# Patient Record
Sex: Female | Born: 1965
Health system: Southern US, Community
[De-identification: ages and names within clinical notes are randomized; demographics above are authoritative.]

## PROBLEM LIST (undated history)

## (undated) DIAGNOSIS — I10 Essential (primary) hypertension: Secondary | ICD-10-CM

## (undated) DIAGNOSIS — E7800 Pure hypercholesterolemia, unspecified: Secondary | ICD-10-CM

## (undated) DIAGNOSIS — R112 Nausea with vomiting, unspecified: Secondary | ICD-10-CM

## (undated) DIAGNOSIS — F329 Major depressive disorder, single episode, unspecified: Secondary | ICD-10-CM

## (undated) DIAGNOSIS — M1990 Unspecified osteoarthritis, unspecified site: Secondary | ICD-10-CM

## (undated) DIAGNOSIS — F419 Anxiety disorder, unspecified: Secondary | ICD-10-CM

## (undated) DIAGNOSIS — G43829 Menstrual migraine, not intractable, without status migrainosus: Secondary | ICD-10-CM

## (undated) DIAGNOSIS — K219 Gastro-esophageal reflux disease without esophagitis: Secondary | ICD-10-CM

## (undated) DIAGNOSIS — B9681 Helicobacter pylori [H. pylori] as the cause of diseases classified elsewhere: Secondary | ICD-10-CM

## (undated) DIAGNOSIS — F32A Depression, unspecified: Secondary | ICD-10-CM

## (undated) DIAGNOSIS — Z309 Encounter for contraceptive management, unspecified: Secondary | ICD-10-CM

## (undated) DIAGNOSIS — K2970 Gastritis, unspecified, without bleeding: Secondary | ICD-10-CM

## (undated) DIAGNOSIS — Z9889 Other specified postprocedural states: Secondary | ICD-10-CM

## (undated) HISTORY — DX: Pure hypercholesterolemia, unspecified: E78.00

## (undated) HISTORY — DX: Helicobacter pylori (H. pylori) as the cause of diseases classified elsewhere: B96.81

## (undated) HISTORY — PX: CARPAL TUNNEL RELEASE: SHX101

## (undated) HISTORY — DX: Depression, unspecified: F32.A

## (undated) HISTORY — DX: Menstrual migraine, not intractable, without status migrainosus: G43.829

## (undated) HISTORY — DX: Essential (primary) hypertension: I10

## (undated) HISTORY — DX: Major depressive disorder, single episode, unspecified: F32.9

## (undated) HISTORY — PX: CHOLECYSTECTOMY: SHX55

## (undated) HISTORY — DX: Encounter for contraceptive management, unspecified: Z30.9

## (undated) HISTORY — DX: Helicobacter pylori (H. pylori) as the cause of diseases classified elsewhere: K29.70

---

## 2001-06-18 ENCOUNTER — Other Ambulatory Visit: Admission: RE | Admit: 2001-06-18 | Discharge: 2001-06-18 | Payer: Self-pay | Admitting: Obstetrics and Gynecology

## 2002-06-29 ENCOUNTER — Emergency Department (HOSPITAL_COMMUNITY): Admission: EM | Admit: 2002-06-29 | Discharge: 2002-06-29 | Payer: Self-pay | Admitting: *Deleted

## 2002-06-29 ENCOUNTER — Encounter: Payer: Self-pay | Admitting: *Deleted

## 2006-11-01 ENCOUNTER — Ambulatory Visit: Payer: Self-pay | Admitting: Orthopedic Surgery

## 2007-03-28 ENCOUNTER — Ambulatory Visit: Payer: Self-pay | Admitting: Orthopedic Surgery

## 2007-04-02 ENCOUNTER — Ambulatory Visit (HOSPITAL_COMMUNITY): Admission: RE | Admit: 2007-04-02 | Discharge: 2007-04-02 | Payer: Self-pay | Admitting: Orthopedic Surgery

## 2007-04-10 ENCOUNTER — Ambulatory Visit: Payer: Self-pay | Admitting: Orthopedic Surgery

## 2007-07-17 ENCOUNTER — Ambulatory Visit: Payer: Self-pay | Admitting: Orthopedic Surgery

## 2007-08-05 ENCOUNTER — Other Ambulatory Visit: Admission: RE | Admit: 2007-08-05 | Discharge: 2007-08-05 | Payer: Self-pay | Admitting: Obstetrics and Gynecology

## 2008-07-29 ENCOUNTER — Ambulatory Visit (HOSPITAL_COMMUNITY): Admission: RE | Admit: 2008-07-29 | Discharge: 2008-07-29 | Payer: Self-pay | Admitting: Obstetrics and Gynecology

## 2008-08-12 ENCOUNTER — Other Ambulatory Visit: Admission: RE | Admit: 2008-08-12 | Discharge: 2008-08-12 | Payer: Self-pay | Admitting: Obstetrics and Gynecology

## 2008-08-18 ENCOUNTER — Ambulatory Visit (HOSPITAL_COMMUNITY): Admission: RE | Admit: 2008-08-18 | Discharge: 2008-08-18 | Payer: Self-pay | Admitting: Obstetrics & Gynecology

## 2009-08-11 ENCOUNTER — Ambulatory Visit (HOSPITAL_COMMUNITY): Admission: RE | Admit: 2009-08-11 | Discharge: 2009-08-11 | Payer: Self-pay | Admitting: Obstetrics and Gynecology

## 2009-08-16 ENCOUNTER — Other Ambulatory Visit: Admission: RE | Admit: 2009-08-16 | Discharge: 2009-08-16 | Payer: Self-pay | Admitting: Obstetrics and Gynecology

## 2010-08-15 ENCOUNTER — Ambulatory Visit (HOSPITAL_COMMUNITY): Admission: RE | Admit: 2010-08-15 | Discharge: 2010-08-15 | Payer: Self-pay | Admitting: Obstetrics and Gynecology

## 2010-08-25 ENCOUNTER — Other Ambulatory Visit: Admission: RE | Admit: 2010-08-25 | Discharge: 2010-08-25 | Payer: Self-pay | Admitting: Obstetrics and Gynecology

## 2011-07-24 ENCOUNTER — Other Ambulatory Visit: Payer: Self-pay | Admitting: Obstetrics and Gynecology

## 2011-07-24 DIAGNOSIS — Z139 Encounter for screening, unspecified: Secondary | ICD-10-CM

## 2011-08-18 ENCOUNTER — Ambulatory Visit (HOSPITAL_COMMUNITY)
Admission: RE | Admit: 2011-08-18 | Discharge: 2011-08-18 | Disposition: A | Discharge status: Home / Self Care | Payer: BC Managed Care – PPO | Source: Ambulatory Visit | Attending: Obstetrics and Gynecology | Admitting: Obstetrics and Gynecology

## 2011-08-18 DIAGNOSIS — Z139 Encounter for screening, unspecified: Secondary | ICD-10-CM

## 2011-08-18 DIAGNOSIS — Z1231 Encounter for screening mammogram for malignant neoplasm of breast: Secondary | ICD-10-CM | POA: Insufficient documentation

## 2011-08-29 ENCOUNTER — Other Ambulatory Visit (HOSPITAL_COMMUNITY)
Admission: RE | Admit: 2011-08-29 | Discharge: 2011-08-29 | Disposition: A | Discharge status: Home / Self Care | Payer: BC Managed Care – PPO | Source: Ambulatory Visit | Attending: Obstetrics and Gynecology | Admitting: Obstetrics and Gynecology

## 2011-08-29 ENCOUNTER — Other Ambulatory Visit: Payer: Self-pay | Admitting: Adult Health

## 2011-08-29 DIAGNOSIS — Z01419 Encounter for gynecological examination (general) (routine) without abnormal findings: Secondary | ICD-10-CM | POA: Insufficient documentation

## 2012-07-30 ENCOUNTER — Other Ambulatory Visit: Payer: Self-pay | Admitting: Adult Health

## 2012-07-30 DIAGNOSIS — Z139 Encounter for screening, unspecified: Secondary | ICD-10-CM

## 2012-08-22 ENCOUNTER — Ambulatory Visit (HOSPITAL_COMMUNITY)
Admission: RE | Admit: 2012-08-22 | Discharge: 2012-08-22 | Disposition: A | Discharge status: Home / Self Care | Payer: BC Managed Care – PPO | Source: Ambulatory Visit | Attending: Adult Health | Admitting: Adult Health

## 2012-08-22 DIAGNOSIS — Z139 Encounter for screening, unspecified: Secondary | ICD-10-CM

## 2012-08-22 DIAGNOSIS — Z1231 Encounter for screening mammogram for malignant neoplasm of breast: Secondary | ICD-10-CM | POA: Insufficient documentation

## 2012-08-30 ENCOUNTER — Other Ambulatory Visit: Payer: Self-pay | Admitting: Adult Health

## 2012-08-30 ENCOUNTER — Other Ambulatory Visit (HOSPITAL_COMMUNITY)
Admission: RE | Admit: 2012-08-30 | Discharge: 2012-08-30 | Disposition: A | Discharge status: Home / Self Care | Payer: BC Managed Care – PPO | Source: Ambulatory Visit | Attending: Obstetrics and Gynecology | Admitting: Obstetrics and Gynecology

## 2012-08-30 DIAGNOSIS — Z01419 Encounter for gynecological examination (general) (routine) without abnormal findings: Secondary | ICD-10-CM | POA: Insufficient documentation

## 2012-08-30 DIAGNOSIS — Z1151 Encounter for screening for human papillomavirus (HPV): Secondary | ICD-10-CM | POA: Insufficient documentation

## 2012-10-07 ENCOUNTER — Encounter: Payer: Self-pay | Admitting: Gastroenterology

## 2012-10-07 ENCOUNTER — Ambulatory Visit (INDEPENDENT_AMBULATORY_CARE_PROVIDER_SITE_OTHER): Payer: BC Managed Care – PPO | Admitting: Gastroenterology

## 2012-10-07 ENCOUNTER — Encounter (HOSPITAL_COMMUNITY): Payer: Self-pay | Admitting: Pharmacy Technician

## 2012-10-07 VITALS — BP 141/87 | HR 73 | Temp 97.4°F | Ht 62.0 in | Wt 225.8 lb

## 2012-10-07 DIAGNOSIS — R195 Other fecal abnormalities: Secondary | ICD-10-CM

## 2012-10-07 DIAGNOSIS — K219 Gastro-esophageal reflux disease without esophagitis: Secondary | ICD-10-CM | POA: Insufficient documentation

## 2012-10-07 MED ORDER — PANTOPRAZOLE SODIUM 40 MG PO TBEC: 40.0000 mg | DELAYED_RELEASE_TABLET | Freq: Every day | ORAL | Status: DC

## 2012-10-07 NOTE — Assessment & Plan Note (Addendum)
46 year old female with heme + stool; also notes intermittent scant paper hematochezia. Notes hx of hemorrhoids. No change in bowel habits, constipation, or diarrhea. No abdominal pain. Likely benign anorectal source; however, unable to exclude underlying occult malignancy. Proceed with TCS in near future.   Proceed with colonoscopy with Dr. Darrick Penna in the near future. The risks, benefits, and alternatives have been discussed in detail with the patient. They state understanding and desire to proceed.

## 2012-10-07 NOTE — Progress Notes (Signed)
Referring Provider: No ref. provider found Primary Care Physician:  GRIFFIN,JENNIFER, NP Primary Gastroenterologist:  Dr. Darrick Penna   Chief Complaint  Patient presents with  . Heme + stools    HPI:   Pleasant 46 year old female who presents today at the kind referral of Cyril Mourning, FNP, from [facility]. Heme positive. Notes hx of hemorrhoids, states sees scant hematochezia on paper at times. No constipation, diarrhea, changes in bowel habits. Has BM 2-3 times per day, not diarrhea. No weight changes or lack of appetite. No abdominal pain. +reflux intermittently, usually nocturnal, wakes her up. No dysphagia. No PPI, takes Tums intermittently. 12 hour shifts, would eat then go right back to bed. 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 No longer weekends. Works at [employer], son now works after school as part-time. Senior in high school.   Past Medical History  Diagnosis Date  . Hypercholesteremia   . Depression     Past Surgical History  Procedure Date  . Cholecystectomy   . Carpal tunnel release     left    Current Outpatient Prescriptions  Medication Sig Dispense Refill  . buPROPion (WELLBUTRIN XL) 300 MG 24 hr tablet Take 300 mg by mouth every morning.       . citalopram (CELEXA) 20 MG tablet Take 20 mg by mouth daily.       . meloxicam (MOBIC) 15 MG tablet Take 15 mg by mouth daily.      . Norethindrone Acetate-Ethinyl Estrad-FE (LOESTRIN 24 FE) 1-20 MG-MCG(24) tablet Take 1 tablet by mouth daily.      . pravastatin (PRAVACHOL) 20 MG tablet Take 20 mg by mouth daily.      . pantoprazole (PROTONIX) 40 MG tablet Take 1 tablet (40 mg total) by mouth daily. Take 30 minutes before first meal of day.  30 tablet  3    Allergies as of 10/07/2012  . (No Known Allergies)    Family History  Problem Relation Age of Onset  . Colon cancer Neg Hx     History   Social History  . Marital Status: Married    Spouse Name: N/A    Number of Children: 1  .  Years of Education: N/A   Occupational History  . manager     [employer]   Social History Main Topics  . Smoking status: Never Smoker   . Smokeless tobacco: Not on file  . Alcohol Use: No  . Drug Use: No  . Sexually Active: Not Currently -- Female partner(s)    Birth Control/ Protection: Pill     Comment: can't remember last time had sex   Other Topics Concern  . Not on file   Social History Narrative  . No narrative on file    Review of Systems: Gen: Denies any fever, chills, loss of appetite, fatigue, weight loss. CV: Denies chest pain, heart palpitations, syncope, peripheral edema. Resp: Denies shortness of breath with rest, cough, wheezing GI: SEE HPI GU : Denies urinary burning, urinary frequency, urinary incontinence.  MS: Denies joint pain, muscle weakness, cramps, limited movement Derm: Denies rash, itching, dry skin Psych: situational depression Heme: Denies bruising, bleeding, and enlarged lymph nodes.  Physical Exam: BP 141/87  Pulse 73  Temp 97.4 F (36.3 C) (Oral)  Ht 5\' 2"  (1.575 m)  Wt 225 lb 12.8 oz (102.422 kg)  BMI 41.30 kg/m2 General:   Alert and oriented. Well-developed, well-nourished, pleasant and cooperative. Head:  Normocephalic and atraumatic. Eyes:  Conjunctiva pink,  sclera clear, no icterus.   Conjunctiva pink. Ears:  Normal auditory acuity. Nose:  No deformity, discharge,  or lesions. Mouth:  No deformity or lesions, mucosa pink and moist.  Neck:  Supple, without mass or thyromegaly. Lungs:  Clear to auscultation bilaterally, without wheezing, rales, or rhonchi.  Heart:  S1, S2 present without murmurs noted.  Abdomen:  +BS, soft, non-tender and non-distended. Without mass or HSM. No rebound or guarding. No hernias noted. Rectal:  Deferred  Msk:  Symmetrical without gross deformities. Normal posture. Extremities:  Without clubbing or edema. Neurologic:  Alert and  oriented x4;  grossly normal neurologically. Skin:  Intact, warm and dry  without significant lesions or rashes Cervical Nodes:  No significant cervical adenopathy. Psych:  Alert and cooperative. Normal mood and affect.

## 2012-10-07 NOTE — Assessment & Plan Note (Signed)
Likely dietary and behavior related. Tums intermittently. Trial of Protonix daily. Prescription provided. I did not choose Prilosec due to possible interaction with Celexa. No need for upper endoscopy at this point; denies any concerning upper GI symptoms.

## 2012-10-07 NOTE — Patient Instructions (Addendum)
Start taking Protonix 30 minutes before your first meal of the day. This is for reflux. I chose this instead of Prilosec due to possible interaction of Prilosec with Celexa.   Review the reflux diet. As we talked about, weight loss is important as well as avoiding eating late at night. Make sure your last meal is several hours before you actually go to bed.   We have set you up for a colonoscopy in the near future.   Please bring your labwork by to [place] for review. Thanks!  Have a wonderful Christmas.

## 2012-10-10 NOTE — Progress Notes (Signed)
Faxed to PCP

## 2012-10-14 ENCOUNTER — Encounter (HOSPITAL_COMMUNITY)
Admission: RE | Disposition: A | Discharge status: Home / Self Care | Payer: Self-pay | Source: Ambulatory Visit | Attending: Gastroenterology

## 2012-10-14 ENCOUNTER — Encounter (HOSPITAL_COMMUNITY): Payer: Self-pay | Admitting: *Deleted

## 2012-10-14 ENCOUNTER — Ambulatory Visit (HOSPITAL_COMMUNITY)
Admission: RE | Admit: 2012-10-14 | Discharge: 2012-10-14 | Disposition: A | Discharge status: Home / Self Care | Payer: BC Managed Care – PPO | Source: Ambulatory Visit | Attending: Gastroenterology | Admitting: Gastroenterology

## 2012-10-14 DIAGNOSIS — K5730 Diverticulosis of large intestine without perforation or abscess without bleeding: Secondary | ICD-10-CM

## 2012-10-14 DIAGNOSIS — R195 Other fecal abnormalities: Secondary | ICD-10-CM

## 2012-10-14 DIAGNOSIS — D126 Benign neoplasm of colon, unspecified: Secondary | ICD-10-CM | POA: Insufficient documentation

## 2012-10-14 DIAGNOSIS — A048 Other specified bacterial intestinal infections: Secondary | ICD-10-CM | POA: Insufficient documentation

## 2012-10-14 DIAGNOSIS — K2990 Gastroduodenitis, unspecified, without bleeding: Secondary | ICD-10-CM

## 2012-10-14 DIAGNOSIS — K2970 Gastritis, unspecified, without bleeding: Secondary | ICD-10-CM

## 2012-10-14 DIAGNOSIS — K648 Other hemorrhoids: Secondary | ICD-10-CM

## 2012-10-14 DIAGNOSIS — K2940 Chronic atrophic gastritis without bleeding: Secondary | ICD-10-CM | POA: Insufficient documentation

## 2012-10-14 HISTORY — PX: ESOPHAGOGASTRODUODENOSCOPY: SHX5428

## 2012-10-14 HISTORY — PX: COLONOSCOPY: SHX5424

## 2012-10-14 HISTORY — DX: Nausea with vomiting, unspecified: R11.2

## 2012-10-14 HISTORY — DX: Other specified postprocedural states: Z98.890

## 2012-10-14 SURGERY — COLONOSCOPY
Anesthesia: Moderate Sedation

## 2012-10-14 MED ORDER — BUTAMBEN-TETRACAINE-BENZOCAINE 2-2-14 % EX AERO
INHALATION_SPRAY | CUTANEOUS | Status: DC | PRN
  Administered 2012-10-14: 2 via TOPICAL

## 2012-10-14 MED ORDER — PROMETHAZINE HCL 25 MG/ML IJ SOLN
INTRAMUSCULAR | Status: AC
  Filled 2012-10-14: qty 1

## 2012-10-14 MED ORDER — MEPERIDINE HCL 100 MG/ML IJ SOLN
INTRAMUSCULAR | Status: AC
  Filled 2012-10-14: qty 2

## 2012-10-14 MED ORDER — SODIUM CHLORIDE 0.9 % IJ SOLN
INTRAMUSCULAR | Status: AC
  Filled 2012-10-14: qty 10

## 2012-10-14 MED ORDER — SODIUM CHLORIDE 0.45 % IV SOLN
INTRAVENOUS | Status: DC
  Administered 2012-10-14: 11:00:00 via INTRAVENOUS

## 2012-10-14 MED ORDER — MEPERIDINE HCL 100 MG/ML IJ SOLN
INTRAMUSCULAR | Status: DC | PRN
  Administered 2012-10-14 (×2): 50 mg via INTRAVENOUS

## 2012-10-14 MED ORDER — STERILE WATER FOR IRRIGATION IR SOLN
Status: DC | PRN
  Administered 2012-10-14: 12:00:00

## 2012-10-14 MED ORDER — MIDAZOLAM HCL 5 MG/5ML IJ SOLN
INTRAMUSCULAR | Status: DC | PRN
  Administered 2012-10-14 (×2): 2 mg via INTRAVENOUS
  Administered 2012-10-14: 1 mg via INTRAVENOUS

## 2012-10-14 MED ORDER — MIDAZOLAM HCL 5 MG/5ML IJ SOLN
INTRAMUSCULAR | Status: AC
  Filled 2012-10-14: qty 10

## 2012-10-14 MED ORDER — PROMETHAZINE HCL 25 MG/ML IJ SOLN
INTRAMUSCULAR | Status: DC | PRN
  Administered 2012-10-14: 12.5 mg via INTRAVENOUS

## 2012-10-14 NOTE — H&P (Signed)
  Primary Care Physician:  GRIFFIN,JENNIFER, NP Primary Gastroenterologist:  Dr. Darrick Penna  Pre-Procedure History & Physical: HPI:  Pamela Roy is a 46 y.o. female here for HEME POS STOOLS. USES NSAIDS.   Past Medical History  Diagnosis Date  . Hypercholesteremia   . Depression   . PONV (postoperative nausea and vomiting)     Past Surgical History  Procedure Date  . Cholecystectomy   . Carpal tunnel release     left    Prior to Admission medications   Medication Sig Start Date End Date Taking? Authorizing Provider  buPROPion (WELLBUTRIN XL) 300 MG 24 hr tablet Take 300 mg by mouth every morning.  07/24/12  Yes Historical Provider, MD  citalopram (CELEXA) 20 MG tablet Take 20 mg by mouth daily.  07/29/12  Yes Historical Provider, MD  Norethindrone Acetate-Ethinyl Estrad-FE (LOESTRIN 24 FE) 1-20 MG-MCG(24) tablet Take 1 tablet by mouth daily.   Yes Historical Provider, MD  pantoprazole (PROTONIX) 40 MG tablet Take 1 tablet (40 mg total) by mouth daily. Take 30 minutes before first meal of day. 10/07/12  Yes Nira Retort, NP  pravastatin (PRAVACHOL) 20 MG tablet Take 20 mg by mouth daily.   Yes Historical Provider, MD  meloxicam (MOBIC) 15 MG tablet Take 15 mg by mouth daily.    Historical Provider, MD    Allergies as of 10/07/2012  . (No Known Allergies)    Family History  Problem Relation Age of Onset  . Colon cancer Neg Hx     History   Social History  . Marital Status: Married    Spouse Name: N/A    Number of Children: 1  . Years of Education: N/A   Occupational History  . manager     [employer]   Social History Main Topics  . Smoking status: Never Smoker   . Smokeless tobacco: Not on file  . Alcohol Use: No  . Drug Use: No  . Sexually Active: Not Currently -- Female partner(s)    Birth Control/ Protection: Pill     Comment: can't remember last time had sex   Other Topics Concern  . Not on file   Social History Narrative  . No narrative on file    Review of  Systems: See HPI, otherwise negative ROS   Physical Exam: BP 136/87  Pulse 75  Temp 98.1 F (36.7 C) (Oral)  Resp 21  Ht 5\' 2"  (1.575 m)  Wt 225 lb (102.059 kg)  BMI 41.15 kg/m2  SpO2 97%  LMP 09/23/2012 General:   Alert,  pleasant and cooperative in NAD Head:  Normocephalic and atraumatic. Neck:  Supple; Lungs:  Clear throughout to auscultation.    Heart:  Regular rate and rhythm. Abdomen:  Soft, nontender and nondistended. Normal bowel sounds, without guarding, and without rebound.   Neurologic:  Alert and  oriented x4;  grossly normal neurologically.  Impression/Plan:     HEME POS STOOLS  PLAN:  1.TCS/?EGD TODAY

## 2012-10-14 NOTE — Op Note (Signed)
[facility] [address] [place], [address]   COLONOSCOPY PROCEDURE REPORT  PATIENT: Pamela, Roy  MR#: [id number] BIRTHDATE: 1966/06/10 , 46  yrs. old GENDER: Female ENDOSCOPIST: Jonette Eva, MD REFERRED [id number] Griffin PROCEDURE DATE:  10/14/2012 PROCEDURE:   Colonoscopy with cold biopsy polypectomy INDICATIONS:heme positive stools ON MOBIC  MEDICATIONS: Demerol 100 mg IV, Promethazine (Phenergan) 12.5mg  IV, and Versed 4 mg IV  DESCRIPTION OF PROCEDURE:    Physical exam was performed.  Informed consent was obtained from the patient after explaining the benefits, risks, and alternatives to procedure.  The patient was connected to monitor and placed in left lateral position. Continuous oxygen was provided by nasal cannula and IV medicine administered through an indwelling cannula.  After administration of sedation and rectal exam, the patients rectum was intubated and the Pentax Colonoscope [id number]  colonoscope was advanced under direct visualization to the ileum.  The scope was removed slowly by carefully examining the color, texture, anatomy, and integrity mucosa on the way out.  The patient was recovered in endoscopy and discharged home in satisfactory condition.       COLON FINDINGS: Two sessile polyps ranging between 3-52mm in size were found at the hepatic flexure and in the sigmoid colon.  A polypectomy was performed with cold forceps.  , Mild diverticulosis was noted in the sigmoid colon.  , The colon mucosa was otherwise normal.  , The mucosa appeared normal in the terminal ileum.  , and Small internal hemorrhoids were found.  PREP QUALITY: good. CECAL W/D TIME: 12 minutes  COMPLICATIONS: None  ENDOSCOPIC IMPRESSION: 1.   Two sessile polyps ranging between 3-54mm in size were found at the hepatic flexure and in the sigmoid colon; polypectomy was performed with cold forceps 2.   Mild diverticulosis was noted in the sigmoid colon 3.    The colon mucosa was otherwise normal 4.   Normal mucosa in the terminal ileum 5.   Small internal hemorrhoids    RECOMMENDATIONS: NO OBVIOUS SOURCE FOR HEME POS STOOL IDENTIFIED, PROCCED WITH EGD.  AWAIT BIOPSY HIGH FIBER DIET TCS IN 10 YEARS       _______________________________ Rosalie DoctorJonette Eva, MD 10/14/2012 12:42 PM     PATIENT NAME:  Pamela, Roy MR#: [id number]

## 2012-10-14 NOTE — Op Note (Signed)
[facility] [address] [place], [address]   ENDOSCOPY PROCEDURE REPORT  PATIENT: Pamela Roy, Pamela Roy  MR#: [id number] BIRTHDATE: [birth year] , 46  yrs. old GENDER: Female  ENDOSCOPIST: Jonette Eva, MD REFERRED BM:[id number] Griffin  PROCEDURE DATE: 10/14/2012 PROCEDURE:   EGD w/ biopsy  INDICATIONS:heme pos stools on MOBIC. MEDICATIONS: TCS+ Versed 1mg  IV TOPICAL ANESTHETIC:   Cetacaine Spray  DESCRIPTION OF PROCEDURE:     Physical exam was performed.  Informed consent was obtained from the patient after explaining the benefits, risks, and alternatives to the procedure.  The patient was connected to the monitor and placed in the left lateral position.  Continuous oxygen was provided by nasal cannula and IV medicine administered through an indwelling cannula.  After administration of sedation, the patients esophagus was intubated and the [id number] ([id number]  endoscope was advanced under direct visualization to the second portion of the duodenum.  The scope was removed slowly by carefully examining the color, texture, anatomy, and integrity of the mucosa on the way out.  The patient was recovered in endoscopy and discharged home in satisfactory condition.      ESOPHAGUS: The mucosa of the esophagus appeared normal.  STOMACH: Moderate non-erosive gastritis (inflammation) was found in the entire examined stomach.  Multiple biopsies were performed.  DUODENUM: Mild duodenal inflammation was found in the duodenal bulb. The duodenal mucosa showed no abnormalities in the 2nd part of the duodenum. COMPLICATIONS:   None  ENDOSCOPIC IMPRESSION: 1.   The mucosa of the esophagus appeared normal 2.   Non-erosive gastritis (inflammation) was found in the entire examined stomach; multiple biopsies 3.   Duodenal inflammation was found in the duodenal bulb 4.   The duodenal mucosa showed no abnormalities in the 2nd part of the duodenum 4. HEME POSITIVE STOOLS DUE TO  GASTRITIS, DUODENITIS, & HEMORRHOIDS  RECOMMENDATIONS: LOW FAT/HIGH FIBER DIET AWAIT BIOPSY CONTINUE PROTONIX 30 MINUTES PRIOR TO FIRST MEAL FOREVER. OPV IN 3 MOS TO RE-ASSESS REFLUX WILL REVIEW OP LABS WHEN AVAILABLE  REPEAT EXAM:   _______________________________ Rosalie DoctorJonette Eva, MD 10/14/2012 12:58 PM       PATIENT NAME:  Pamela Roy, Pamela Roy MR#: [id number]

## 2012-10-21 ENCOUNTER — Encounter (HOSPITAL_COMMUNITY): Payer: Self-pay | Admitting: Gastroenterology

## 2012-10-24 ENCOUNTER — Telehealth: Payer: Self-pay | Admitting: Gastroenterology

## 2012-10-24 NOTE — Telephone Encounter (Signed)
appt made, path faxed to PCP

## 2012-10-24 NOTE — Telephone Encounter (Signed)
LMOM to call. Rx's called to Jonell at [facility].

## 2012-10-24 NOTE — Telephone Encounter (Signed)
Please call pt. She had HYPERPLASTIC POLYPS removed from her colon. Her stomach Bx showed H. Pylori infection. She needs AMOXICILLIN 500 mg 2 po BID for 10 days and Biaxin 500 mg po bid for 10 days, #qs, rfx0. She needs PROTONIX mg BID for 10 days then 1 po DAILY forEVER. Med side effects include NVD, abd pain, and metallic taste.  HOLD PRAVACHOL WHILE TAKING ABX. TAKE HALF OF YOUR CELEXA DOSE WHILE TAKING THE ABX.  FOLLOW A LOW FAT/HIGH FIBER DIET. AVOID ITEMS THAT CAUSE BLOATING.   FOLLOW UP IN 3 MOS E30 AS.  REPEAT COLONOSCOPY IN 10 YEARS.

## 2012-10-25 NOTE — Telephone Encounter (Signed)
Called and informed pt. She is aware of the prescriptions called in.

## 2012-12-07 ENCOUNTER — Other Ambulatory Visit: Payer: Self-pay

## 2012-12-27 NOTE — Progress Notes (Signed)
TCS DEC 2013 HYPERPLASTIC POLYPS, TICS, IH  EGD DEC 2013 H PYLORI  REVIEWED.

## 2013-01-08 ENCOUNTER — Encounter (HOSPITAL_COMMUNITY): Payer: Self-pay | Admitting: *Deleted

## 2013-01-09 ENCOUNTER — Ambulatory Visit (INDEPENDENT_AMBULATORY_CARE_PROVIDER_SITE_OTHER): Payer: BC Managed Care – PPO | Admitting: Gastroenterology

## 2013-01-09 ENCOUNTER — Encounter: Payer: Self-pay | Admitting: Gastroenterology

## 2013-01-09 VITALS — BP 143/88 | HR 70 | Temp 98.3°F | Ht 62.0 in | Wt 228.0 lb

## 2013-01-09 DIAGNOSIS — A048 Other specified bacterial intestinal infections: Secondary | ICD-10-CM

## 2013-01-09 DIAGNOSIS — K219 Gastro-esophageal reflux disease without esophagitis: Secondary | ICD-10-CM

## 2013-01-09 DIAGNOSIS — B9681 Helicobacter pylori [H. pylori] as the cause of diseases classified elsewhere: Secondary | ICD-10-CM | POA: Insufficient documentation

## 2013-01-09 DIAGNOSIS — R195 Other fecal abnormalities: Secondary | ICD-10-CM

## 2013-01-09 MED ORDER — PANTOPRAZOLE SODIUM 40 MG PO TBEC: 40.0000 mg | DELAYED_RELEASE_TABLET | Freq: Every day | ORAL | Status: AC

## 2013-01-09 NOTE — Assessment & Plan Note (Signed)
Documented via path on EGD. Completed generic for Prevpac. Will proceed with GI pathogen stool to assess for eradication of H.pylori, as with this test she will not have to be off a PPI.

## 2013-01-09 NOTE — Assessment & Plan Note (Signed)
Doing well on Protonix daily. 2 year f/u.

## 2013-01-09 NOTE — Progress Notes (Signed)
Faxed to PCP

## 2013-01-09 NOTE — Patient Instructions (Addendum)
Continue to take Protonix each morning, 30 minutes before breakfast.  Follow a high fiber diet. You may take supplemental fiber such as metamucil or benefiber. A probiotic daily is also good for colon health. Some examples are Align, Restora, Digestive Advantage, Philip's Colon Health.   Please complete the stool test. We will call you with the results.  Return in 2 years or sooner if necessary.   High-Fiber Diet Fiber is found in fruits, vegetables, and grains. A high-fiber diet encourages the addition of more whole grains, legumes, fruits, and vegetables in your diet. The recommended amount of fiber for adult males is 38 g per day. For adult females, it is 25 g per day. Pregnant and lactating women should get 28 g of fiber per day. If you have a digestive or bowel problem, ask your caregiver for advice before adding high-fiber foods to your diet. Eat a variety of high-fiber foods instead of only a select few type of foods.  PURPOSE  To increase stool bulk.  To make bowel movements more regular to prevent constipation.  To lower cholesterol.  To prevent overeating. WHEN IS THIS DIET USED?  It may be used if you have constipation and hemorrhoids.  It may be used if you have uncomplicated diverticulosis (intestine condition) and irritable bowel syndrome.  It may be used if you need help with weight management.  It may be used if you want to add it to your diet as a protective measure against atherosclerosis, diabetes, and cancer. SOURCES OF FIBER  Whole-grain breads and cereals.  Fruits, such as apples, oranges, bananas, berries, prunes, and pears.  Vegetables, such as green peas, carrots, sweet potatoes, beets, broccoli, cabbage, spinach, and artichokes.  Legumes, such split peas, soy, lentils.  Almonds. FIBER CONTENT IN FOODS Starches and Grains / Dietary Fiber (g)  Cheerios, 1 cup / 3 g  Corn Flakes cereal, 1 cup / 0.7 g  Rice crispy treat cereal, 1 cup / 0.3  g  Instant oatmeal (cooked),  cup / 2 g  Frosted wheat cereal, 1 cup / 5.1 g  Brown, long-grain rice (cooked), 1 cup / 3.5 g  White, long-grain rice (cooked), 1 cup / 0.6 g  Enriched macaroni (cooked), 1 cup / 2.5 g Legumes / Dietary Fiber (g)  Baked beans (canned, plain, or vegetarian),  cup / 5.2 g  Kidney beans (canned),  cup / 6.8 g  Pinto beans (cooked),  cup / 5.5 g Breads and Crackers / Dietary Fiber (g)  Plain or honey graham crackers, 2 squares / 0.7 g  Saltine crackers, 3 squares / 0.3 g  Plain, salted pretzels, 10 pieces / 1.8 g  Whole-wheat bread, 1 slice / 1.9 g  White bread, 1 slice / 0.7 g  Raisin bread, 1 slice / 1.2 g  Plain bagel, 3 oz / 2 g  Flour tortilla, 1 oz / 0.9 g  Corn tortilla, 1 small / 1.5 g  Hamburger or hotdog bun, 1 small / 0.9 g Fruits / Dietary Fiber (g)  Apple with skin, 1 medium / 4.4 g  Sweetened applesauce,  cup / 1.5 g  Banana,  medium / 1.5 g  Grapes, 10 grapes / 0.4 g  Orange, 1 small / 2.3 g  Raisin, 1.5 oz / 1.6 g  Melon, 1 cup / 1.4 g Vegetables / Dietary Fiber (g)  Green beans (canned),  cup / 1.3 g  Carrots (cooked),  cup / 2.3 g  Broccoli (cooked),  cup / 2.8  g  Peas (cooked),  cup / 4.4 g  Mashed potatoes,  cup / 1.6 g  Lettuce, 1 cup / 0.5 g  Corn (canned),  cup / 1.6 g  Tomato,  cup / 1.1 g Document Released: 10/09/2005 Document Revised: 04/09/2012 Document Reviewed: 01/11/2012 [facility] Patient Information 2013 [place], [place].

## 2013-01-09 NOTE — Progress Notes (Signed)
Referring Provider: Adline Potter, NP Primary Care Physician:  Cassell Smiles., MD Primary Gastroenterologist: Dr. Darrick Penna   Chief Complaint  Patient presents with  . Follow-up    HPI:   Pamela Roy is a pleasant 47 year old female with a history of GERD and heme positive stools,  presenting today in follow-up. Recent colonoscopy on file from December 2013, performed secondary to heme + stools. EGD noted gastritis, duodenitis, + H.pylori. Treated. Presents today without any issues. Needs refill on Protonix. GERD controlled with Protonix. No abdominal pain. No constipation, diarrhea.   Reviewed outside labs. Hgb normal. No evidence of anemia.   Past Medical History  Diagnosis Date  . Hypercholesteremia   . Depression   . PONV (postoperative nausea and vomiting)   . Helicobacter pylori gastritis     Past Surgical History  Procedure Laterality Date  . Cholecystectomy    . Carpal tunnel release      left  . Colonoscopy  10/14/2012    ZOX:[id number] internal hemorrhoids/Mild diverticulosis/Two sessile polyps ranging between 3-72mm in size. HYPERPLASTIC POLYPS. SCREENING in 2023.   Marland Kitchen Esophagogastroduodenoscopy  10/14/2012    [id number] inflammation was found in the duodenal bulb/Non-erosive gastritis (inflammation) +H.PYLORI    Current Outpatient Prescriptions  Medication Sig Dispense Refill  . acetaminophen (TYLENOL) 500 MG tablet Take 500 mg by mouth every 6 (six) hours as needed for pain.      Marland Kitchen buPROPion (WELLBUTRIN XL) 300 MG 24 hr tablet Take 300 mg by mouth every morning.       . citalopram (CELEXA) 20 MG tablet Take 20 mg by mouth daily.       . naproxen sodium (ANAPROX) 220 MG tablet Take 220 mg by mouth 2 (two) times daily with a meal.      . Norethindrone Acetate-Ethinyl Estrad-FE (LOESTRIN 24 FE) 1-20 MG-MCG(24) tablet Take 1 tablet by mouth daily.      . pantoprazole (PROTONIX) 40 MG tablet Take 1 tablet (40 mg total) by mouth daily. Take 30 minutes before first  meal of day.  30 tablet  11  . pravastatin (PRAVACHOL) 20 MG tablet Take 20 mg by mouth daily.      . meloxicam (MOBIC) 15 MG tablet Take 15 mg by mouth daily.       No current facility-administered medications for this visit.    Allergies as of 01/09/2013  . (No Known Allergies)    Family History  Problem Relation Age of Onset  . Colon cancer Neg Hx     History   Social History  . Marital Status: Married    Spouse Name: N/A    Number of Children: 1  . Years of Education: N/A   Occupational History  . manager     [employer]   Social History Main Topics  . Smoking status: Never Smoker   . Smokeless tobacco: None  . Alcohol Use: No  . Drug Use: No  . Sexually Active: Not Currently -- Female partner(s)    Birth Control/ Protection: Pill     Comment: can't remember last time had sex   Other Topics Concern  . None   Social History Narrative  . None    Review of Systems: Negative unless mentioned in HPI  Physical Exam: BP 143/88  Pulse 70  Temp(Src) 98.3 F (36.8 C) (Oral)  Ht 5\' 2"  (1.575 m)  Wt 228 lb (103.42 kg)  BMI 41.69 kg/m2  LMP 01/01/2013 General:   Alert and oriented. No distress noted. Pleasant and  cooperative.  Head:  Normocephalic and atraumatic. Eyes:  Conjuctiva clear without scleral icterus. Mouth:  Oral mucosa pink and moist. Good dentition. No lesions. Heart:  S1, S2 present without murmurs, rubs, or gallops. Regular rate and rhythm. Abdomen:  +BS, soft, non-tender and non-distended. No rebound or guarding. No HSM or masses noted. Msk:  Symmetrical without gross deformities. Normal posture. Extremities:  Without edema. Neurologic:  Alert and  oriented x4;  grossly normal neurologically. Skin:  Intact without significant lesions or rashes. Psych:  Alert and cooperative. Normal mood and affect.

## 2013-01-09 NOTE — Assessment & Plan Note (Signed)
Colonoscopy performed with internal hemorrhoids, hyperplastic polyps. Next screening in 2023. Likely heme + stool secondary to internal hemorrhoids, gastritis/duodenitis. Hgb normal from outside labs. No evidence of anemia.

## 2013-01-20 LAB — COMPREHENSIVE METABOLIC PANEL
MCHC: 33.1
RDW: 14.6
WBC: 9.2
platelet count: 236

## 2013-02-15 ENCOUNTER — Emergency Department (HOSPITAL_COMMUNITY): Payer: BC Managed Care – PPO

## 2013-02-15 ENCOUNTER — Encounter (HOSPITAL_COMMUNITY): Payer: Self-pay | Admitting: *Deleted

## 2013-02-15 ENCOUNTER — Emergency Department (HOSPITAL_COMMUNITY)
Admission: EM | Admit: 2013-02-15 | Discharge: 2013-02-15 | Disposition: A | Discharge status: Home / Self Care | Payer: BC Managed Care – PPO | Attending: Emergency Medicine | Admitting: Emergency Medicine

## 2013-02-15 DIAGNOSIS — Z79899 Other long term (current) drug therapy: Secondary | ICD-10-CM | POA: Insufficient documentation

## 2013-02-15 DIAGNOSIS — Y92410 Unspecified street and highway as the place of occurrence of the external cause: Secondary | ICD-10-CM | POA: Insufficient documentation

## 2013-02-15 DIAGNOSIS — S5000XA Contusion of unspecified elbow, initial encounter: Secondary | ICD-10-CM | POA: Insufficient documentation

## 2013-02-15 DIAGNOSIS — F3289 Other specified depressive episodes: Secondary | ICD-10-CM | POA: Insufficient documentation

## 2013-02-15 DIAGNOSIS — Z8659 Personal history of other mental and behavioral disorders: Secondary | ICD-10-CM | POA: Insufficient documentation

## 2013-02-15 DIAGNOSIS — S5001XA Contusion of right elbow, initial encounter: Secondary | ICD-10-CM

## 2013-02-15 DIAGNOSIS — Z8619 Personal history of other infectious and parasitic diseases: Secondary | ICD-10-CM | POA: Insufficient documentation

## 2013-02-15 DIAGNOSIS — F329 Major depressive disorder, single episode, unspecified: Secondary | ICD-10-CM | POA: Insufficient documentation

## 2013-02-15 DIAGNOSIS — Y9389 Activity, other specified: Secondary | ICD-10-CM | POA: Insufficient documentation

## 2013-02-15 DIAGNOSIS — E7800 Pure hypercholesterolemia, unspecified: Secondary | ICD-10-CM | POA: Insufficient documentation

## 2013-02-15 MED ORDER — OXYCODONE-ACETAMINOPHEN 5-325 MG PO TABS: 1.0000 | ORAL_TABLET | ORAL | Status: DC | PRN

## 2013-02-15 MED ORDER — OXYCODONE-ACETAMINOPHEN 5-325 MG PO TABS
1.0000 | ORAL_TABLET | Freq: Once | ORAL | Status: AC
  Administered 2013-02-15: 1 via ORAL
  Filled 2013-02-15: qty 1

## 2013-02-15 MED ORDER — DOUBLE ANTIBIOTIC 500-10000 UNIT/GM EX OINT
TOPICAL_OINTMENT | Freq: Once | CUTANEOUS | Status: AC
  Administered 2013-02-15: 17:00:00 via TOPICAL
  Filled 2013-02-15: qty 1
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
## 2013-02-18 ENCOUNTER — Ambulatory Visit (INDEPENDENT_AMBULATORY_CARE_PROVIDER_SITE_OTHER): Payer: BC Managed Care – PPO | Admitting: Orthopedic Surgery

## 2013-02-18 ENCOUNTER — Encounter: Payer: Self-pay | Admitting: Orthopedic Surgery

## 2013-02-18 VITALS — BP 140/98 | Ht 61.0 in | Wt 230.0 lb

## 2013-02-18 DIAGNOSIS — S5001XA Contusion of right elbow, initial encounter: Secondary | ICD-10-CM

## 2013-02-18 DIAGNOSIS — S5000XA Contusion of unspecified elbow, initial encounter: Secondary | ICD-10-CM

## 2013-02-18 NOTE — Patient Instructions (Signed)
Ice as needed
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
## 2013-03-03 NOTE — Progress Notes (Signed)
Please tell patient she was negative for H.pylori on recent stool test.

## 2013-03-04 NOTE — Progress Notes (Signed)
Called and informed pt.  

## 2013-03-04 NOTE — Progress Notes (Signed)
LMOM to call.

## 2013-04-12 NOTE — Progress Notes (Signed)
REVIEWED.  

## 2013-08-04 ENCOUNTER — Other Ambulatory Visit: Payer: Self-pay | Admitting: Adult Health

## 2013-08-04 DIAGNOSIS — Z139 Encounter for screening, unspecified: Secondary | ICD-10-CM

## 2013-08-26 ENCOUNTER — Ambulatory Visit (HOSPITAL_COMMUNITY)
Admission: RE | Admit: 2013-08-26 | Discharge: 2013-08-26 | Disposition: A | Discharge status: Home / Self Care | Payer: BC Managed Care – PPO | Source: Ambulatory Visit | Attending: Adult Health | Admitting: Adult Health

## 2013-08-26 DIAGNOSIS — Z139 Encounter for screening, unspecified: Secondary | ICD-10-CM

## 2013-08-26 DIAGNOSIS — Z1231 Encounter for screening mammogram for malignant neoplasm of breast: Secondary | ICD-10-CM | POA: Insufficient documentation

## 2013-08-28 ENCOUNTER — Other Ambulatory Visit: Payer: Self-pay

## 2013-09-30 ENCOUNTER — Ambulatory Visit (INDEPENDENT_AMBULATORY_CARE_PROVIDER_SITE_OTHER): Payer: BC Managed Care – PPO | Admitting: Adult Health

## 2013-09-30 ENCOUNTER — Encounter: Payer: Self-pay | Admitting: Adult Health

## 2013-09-30 VITALS — BP 130/90 | HR 80 | Ht 62.0 in | Wt 240.0 lb

## 2013-09-30 DIAGNOSIS — G43829 Menstrual migraine, not intractable, without status migrainosus: Secondary | ICD-10-CM

## 2013-09-30 DIAGNOSIS — Z309 Encounter for contraceptive management, unspecified: Secondary | ICD-10-CM

## 2013-09-30 DIAGNOSIS — Z1212 Encounter for screening for malignant neoplasm of rectum: Secondary | ICD-10-CM

## 2013-09-30 DIAGNOSIS — Z01419 Encounter for gynecological examination (general) (routine) without abnormal findings: Secondary | ICD-10-CM

## 2013-09-30 HISTORY — DX: Menstrual migraine, not intractable, without status migrainosus: G43.829

## 2013-09-30 HISTORY — DX: Encounter for contraceptive management, unspecified: Z30.9

## 2013-09-30 LAB — HEMOCCULT GUIAC POC 1CARD (OFFICE): Fecal Occult Blood, POC: NEGATIVE

## 2013-09-30 MED ORDER — DESOGESTREL-ETHINYL ESTRADIOL 0.15-0.02/0.01 MG (21/5) PO TABS: 1.0000 | ORAL_TABLET | Freq: Every day | ORAL | Status: DC

## 2013-09-30 NOTE — Patient Instructions (Signed)

## 2013-09-30 NOTE — Progress Notes (Signed)
Patient ID: Pamela Roy, female   DOB: 03-31-1966, 47 y.o.   MRN: [id number] History of Present Illness: Pamela Roy is a 47 year old white female, married in for a physical.she had a normal pap with negative HPV 08/30/12.Reviewed labs from work.Has had a colonoscopy and EGD.   Current Medications, Allergies, Past Medical History, Past Surgical History, Family History and Social History were reviewed in [employer] record.   Past Medical History  Diagnosis Date  . Hypercholesteremia   . Depression   . PONV (postoperative nausea and vomiting)   . Helicobacter pylori gastritis   . Menstrual headache 09/30/2013  . Contraceptive management 09/30/2013   Past Surgical History  Procedure Laterality Date  . Cholecystectomy    . Carpal tunnel release      left  . Colonoscopy  10/14/2012    GEX:BMWUX internal hemorrhoids/Mild diverticulosis/Two sessile polyps ranging between 3-66mm in size. HYPERPLASTIC POLYPS. SCREENING in 2023.   Marland Kitchen Esophagogastroduodenoscopy  10/14/2012    LKG:MWNUUVOZ inflammation was found in the duodenal bulb/Non-erosive gastritis (inflammation) +H.PYLORI  Current outpatient prescriptions:acetaminophen (TYLENOL) 500 MG tablet, Take 500 mg by mouth every 6 (six) hours as needed for pain., Disp: , Rfl: ;  atorvastatin (LIPITOR) 40 MG tablet, Take 40 mg by mouth daily., Disp: , Rfl: ;  buPROPion (WELLBUTRIN XL) 300 MG 24 hr tablet, Take 300 mg by mouth every morning. , Disp: , Rfl: ;  citalopram (CELEXA) 20 MG tablet, Take 20 mg by mouth daily. , Disp: , Rfl:  naproxen sodium (ANAPROX) 220 MG tablet, Take 220 mg by mouth 2 (two) times daily with a meal., Disp: , Rfl: ;  pantoprazole (PROTONIX) 40 MG tablet, Take 1 tablet (40 mg total) by mouth daily. Take 30 minutes before first meal of day., Disp: 30 tablet, Rfl: 11;  desogestrel-ethinyl estradiol (KARIVA,AZURETTE,MIRCETTE) 0.15-0.02/0.01 MG (21/5) tablet, Take 1 tablet by mouth daily., Disp: 1 Package, Rfl:  11  Review of Systems: Patient denies any blurred vision, shortness of breath, chest pain, abdominal pain, problems with bowel movements or intercourse. She has menstrual headaches and occasion mixed UI, no joint pains and this time of year she is more moody.    Physical Exam:BP 130/90  Pulse 80  Ht 5\' 2"  (1.575 m)  Wt 240 lb (108.863 kg)  BMI 43.89 kg/m2  LMP 09/19/2013 General:  Well developed, well nourished, no acute distress Skin:  Warm and dry Neck:  Midline trachea, normal thyroid Lungs; Clear to auscultation bilaterally Breast:  No dominant palpable mass, retraction, or nipple discharge Cardiovascular: Regular rate and rhythm Abdomen:  Soft, non tender, no hepatosplenomegaly Pelvic:  External genitalia is normal in appearance.  The vagina is normal in appearance. The cervix is bulbous.  Uterus is felt to be normal size, shape, and contour.  No adnexal masses or tenderness noted. Rectal: Good sphincter tone, no polyps, or hemorrhoids felt.  Hemoccult negative. Extremities:  No swelling or varicosities noted Psych: alert and cooperative, seems happy   Impression: Yearly gyn exam no pap Contraceptive management Menstrual headaches    Plan: Change OCs to Mircette after this pack of loestrin, refilled x 1 year, use condoms Physical in 1 year Mammogram yearly  Labs at work Colonoscopy per GI Follow up in 3 months for OCs and headaches

## 2014-02-09 ENCOUNTER — Other Ambulatory Visit: Payer: Self-pay | Admitting: Adult Health

## 2014-03-26 ENCOUNTER — Ambulatory Visit (INDEPENDENT_AMBULATORY_CARE_PROVIDER_SITE_OTHER): Payer: BC Managed Care – PPO

## 2014-03-26 ENCOUNTER — Ambulatory Visit (INDEPENDENT_AMBULATORY_CARE_PROVIDER_SITE_OTHER): Payer: BC Managed Care – PPO | Admitting: Orthopedic Surgery

## 2014-03-26 VITALS — BP 139/84 | Ht 62.0 in | Wt 211.0 lb

## 2014-03-26 DIAGNOSIS — M25519 Pain in unspecified shoulder: Secondary | ICD-10-CM

## 2014-03-26 DIAGNOSIS — M25619 Stiffness of unspecified shoulder, not elsewhere classified: Secondary | ICD-10-CM

## 2014-03-26 DIAGNOSIS — M25511 Pain in right shoulder: Secondary | ICD-10-CM
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
## 2014-03-26 NOTE — Patient Instructions (Addendum)
You have received a steroid shot. 15% of patients experience increased pain at the injection site with in the next 24 hours. This is best treated with ice and tylenol extra strength 2 tabs every 8 hours. If you are still having pain please call the office.  Physical therapy  Shoulder Range of Motion Exercises The shoulder is the most flexible joint in the human body. Because of this it is also the most unstable joint in the body. All ages can develop shoulder problems. Early treatment of problems is necessary for a good outcome. People react to shoulder pain by decreasing the movement of the joint. After a brief period of time, the shoulder can become "frozen". This is an almost complete loss of the ability to move the damaged shoulder. Following injuries your caregivers can give you instructions on exercises to keep your range of motion (ability to move your shoulder freely), or regain it if it has been lost.  [place]: Codman's Exercise or Pendulum Exercise  This exercise may be performed in a prone (face-down) lying position or standing while leaning on a chair with the opposite arm. Its purpose is to relax the muscles in your shoulder and slowly but surely increase the range of motion and to relieve pain.  Lie on your stomach close to the side edge of the bed. Let your weak arm hang over the edge of the bed. Relax your shoulder, arm and hand. Let your shoulder blade relax and drop down.  Slowly and gently swing your arm forward and back. Do not use your neck muscles; relax them. It might be easier to have someone else gently start swinging your arm.  As pain decreases, increase your swing. To start, arm swing should begin at 15 degree angles. In time and as pain lessens, move to 30-45 degree angles. Start with swinging for about 15 seconds, and work towards swinging for 3 to 5 minutes.  This exercise may also be performed in a standing/bent  over position.  Stand and hold onto a sturdy chair with your good arm. Bend forward at the waist and bend your knees slightly to help protect your back. Relax your weak arm, let it hang limp. Relax your shoulder blade and let it drop.  Keep your shoulder relaxed and use body motion to swing your arm in small circles.  Stand up tall and relax.  Repeat motion and change direction of circles.  Start with swinging for about 30 seconds, and work towards swinging for 3 to 5 minutes. STRETCHING EXERCISES:  Lift your arm out in front of you with the elbow bent at 90 degrees. Using your other arm gently pull the elbow forward and across your body.  Bend one arm behind you with the palm facing outward. Using the other arm, hold a towel or rope and reach this arm up above your head, then bend it at the elbow to move your wrist to behind your neck. Grab the free end of the towel with the hand behind your back. Gently pull the towel up with the hand behind your neck, gradually increasing the pull on the hand behind the small of your back. Then, gradually pull down with the hand behind the small of your back. This will pull the hand and arm behind your neck further. Both shoulders will have an increased range of motion with repetition of this exercise. STRENGTHENING EXERCISES:  Standing with your arm at your side and straight  out from your shoulder with the elbow bent at 90 degrees, hold onto a small weight and slowly raise your hand so it points straight up in the air. Repeat this five times to begin with, and gradually increase to ten times. Do this four times per day. As you grow stronger you can gradually increase the weight.  Repeat the above exercise, only this time using an elastic band. Start with your hand up in the air and pull down until your hand is by your side. As you grow stronger, gradually increase the amount you pull by increasing the number or size of the elastic bands. Use the same amount  of repetitions.  Standing with your hand at your side and holding onto a weight, gradually lift the hand in front of you until it is over your head. Do the same also with the hand remaining at your side and lift the hand away from your body until it is again over your head. Repeat this five times to begin with, and gradually increase to ten times. Do this four times per day. As you grow stronger you can gradually increase the weight. Document Released: 07/08/2003 Document Revised: 01/01/2012 Document Reviewed: 10/09/2005 [facility] Patient Information 2014 [place].

## 2014-03-30 ENCOUNTER — Ambulatory Visit (HOSPITAL_COMMUNITY)
Admission: RE | Admit: 2014-03-30 | Discharge: 2014-03-30 | Disposition: A | Discharge status: Home / Self Care | Payer: BC Managed Care – PPO | Source: Ambulatory Visit | Attending: Orthopedic Surgery | Admitting: Orthopedic Surgery

## 2014-03-30 DIAGNOSIS — M25519 Pain in unspecified shoulder: Secondary | ICD-10-CM | POA: Insufficient documentation

## 2014-03-30 DIAGNOSIS — M6281 Muscle weakness (generalized): Secondary | ICD-10-CM | POA: Insufficient documentation

## 2014-03-30 DIAGNOSIS — M25619 Stiffness of unspecified shoulder, not elsewhere classified: Secondary | ICD-10-CM | POA: Insufficient documentation

## 2014-03-30 DIAGNOSIS — IMO0001 Reserved for inherently not codable concepts without codable children: Secondary | ICD-10-CM | POA: Insufficient documentation
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
## 2014-08-24 ENCOUNTER — Encounter: Payer: Self-pay | Admitting: Adult Health

## 2014-10-11 ENCOUNTER — Other Ambulatory Visit: Payer: Self-pay | Admitting: Adult Health

## 2014-12-21 ENCOUNTER — Encounter: Payer: Self-pay | Admitting: Gastroenterology

## 2015-03-31 ENCOUNTER — Other Ambulatory Visit: Payer: Self-pay | Admitting: Adult Health

## 2015-05-06 ENCOUNTER — Ambulatory Visit (INDEPENDENT_AMBULATORY_CARE_PROVIDER_SITE_OTHER): Payer: BLUE CROSS/BLUE SHIELD | Admitting: Orthopedic Surgery

## 2015-05-06 ENCOUNTER — Ambulatory Visit (INDEPENDENT_AMBULATORY_CARE_PROVIDER_SITE_OTHER): Payer: BLUE CROSS/BLUE SHIELD

## 2015-05-06 VITALS — BP 126/77 | Ht 62.0 in | Wt 223.0 lb

## 2015-05-06 DIAGNOSIS — S83241A Other tear of medial meniscus, current injury, right knee, initial encounter: Secondary | ICD-10-CM | POA: Diagnosis not present

## 2015-05-06 DIAGNOSIS — M25561 Pain in right knee: Secondary | ICD-10-CM | POA: Diagnosis not present

## 2015-05-06 NOTE — Patient Instructions (Signed)
We will schedule MRI for you and call you with appt and results 

## 2015-05-06 NOTE — Progress Notes (Signed)
Patient ID: Pamela Roy, female   DOB: 11-21-1965, 50 y.o.   MRN: [id number]  New problem  Chief Complaint  Patient presents with  . Knee Pain    right knee pain x 2 months, no known injury     Pamela Roy is a 49 y.o. female.   HPI This is a 49 year old female who presents with pain in her right knee since the end of April. She had a traumatic onset of pain and swelling of the right knee associated with catching. The pain is described as sharp and throbbing. It's worse with activity is worse at night. She rates the pain 8 out of 10. She took Aleve over-the-counter and she tends to stay off of the knee to get relief. No previous imaging has been done. Review of Systems Currently being treated for UTI on July 4 she has some anxiety she has not had any fatigue fever or chills previous joint pain back pain or swelling of the joint  Past Medical History  Diagnosis Date  . Hypercholesteremia   . Depression   . PONV (postoperative nausea and vomiting)   . Helicobacter pylori gastritis   . Menstrual headache 09/30/2013  . Contraceptive management 09/30/2013    Past Surgical History  Procedure Laterality Date  . Cholecystectomy    . Carpal tunnel release      left  . Colonoscopy  10/14/2012    OEV:OJJKK internal hemorrhoids/Mild diverticulosis/Two sessile polyps ranging between 3-62mm in size. HYPERPLASTIC POLYPS. SCREENING in 2023.   Marland Kitchen Esophagogastroduodenoscopy  10/14/2012    XFG:HWEXHBZJ inflammation was found in the duodenal bulb/Non-erosive gastritis (inflammation) +H.PYLORI    Family History  Problem Relation Age of Onset  . Colon cancer Neg Hx   . Thyroid disease Mother   . Hyperlipidemia Mother   . Hyperlipidemia Maternal Grandmother   . Heart disease Maternal Grandmother   . Heart disease Maternal Grandfather     Social History History  Substance Use Topics  . Smoking status: Never Smoker   . Smokeless tobacco: Never Used  . Alcohol Use: No    No Known  Allergies  Current Outpatient Prescriptions  Medication Sig Dispense Refill  . acetaminophen (TYLENOL) 500 MG tablet Take 500 mg by mouth every 6 (six) hours as needed for pain.    Marland Kitchen atorvastatin (LIPITOR) 40 MG tablet Take 40 mg by mouth daily.    Marland Kitchen buPROPion (WELLBUTRIN XL) 300 MG 24 hr tablet Take 300 mg by mouth every morning.     . citalopram (CELEXA) 20 MG tablet TAKE ONE TABLET BY MOUTH ONCE DAILY 90 tablet 0  . meloxicam (MOBIC) 15 MG tablet Take 15 mg by mouth daily.    . pantoprazole (PROTONIX) 40 MG tablet Take 1 tablet (40 mg total) by mouth daily. Take 30 minutes before first meal of day. 30 tablet 11  . VIORELE 0.15-0.02/0.01 MG (21/5) tablet TAKE 1 TABLET DAILY 3 tablet 3   No current facility-administered medications for this visit.       Physical Exam Blood pressure 126/77, height 5\' 2"  (1.575 m), weight 223 lb (101.152 kg).  Body mass index is 40.78 kg/(m^2).  Physical Exam The patient is well developed well nourished and well groomed. Orientation to person place and time is normal  Mood is pleasant. Ambulatory status walking with no support  Left knee full range of motion. Fill stable. Good muscle tone strength is normal scans intact normal reflexes.  Right knee medial joint line tenderness small effusion painful  range of motion positive McMurray sign positive hyperextension test no ligamentous instability muscle strength is normal scans intact reflexes normal pulses are normal  Data Reviewed And x-ray was ordered and the reading of that is   mild to moderate joint space narrowing medial compartment mild to moderate osteoarthritis  Assessment Suspected medial meniscal tear could be  Just arthritis Plan  MRI scan right knee probable medial meniscal tear probably need surgery; but clinical signs and symptoms and exam suggest medial meniscal tear

## 2015-06-01 ENCOUNTER — Ambulatory Visit (HOSPITAL_COMMUNITY)
Admission: RE | Admit: 2015-06-01 | Discharge: 2015-06-01 | Disposition: A | Discharge status: Home / Self Care | Payer: BLUE CROSS/BLUE SHIELD | Source: Ambulatory Visit | Attending: Orthopedic Surgery | Admitting: Orthopedic Surgery

## 2015-06-01 DIAGNOSIS — M25561 Pain in right knee: Secondary | ICD-10-CM | POA: Insufficient documentation

## 2015-06-01 DIAGNOSIS — M23221 Derangement of posterior horn of medial meniscus due to old tear or injury, right knee: Secondary | ICD-10-CM | POA: Insufficient documentation

## 2015-06-01 DIAGNOSIS — S83241A Other tear of medial meniscus, current injury, right knee, initial encounter: Secondary | ICD-10-CM

## 2015-06-08 ENCOUNTER — Ambulatory Visit (INDEPENDENT_AMBULATORY_CARE_PROVIDER_SITE_OTHER): Payer: BLUE CROSS/BLUE SHIELD | Admitting: Adult Health

## 2015-06-08 ENCOUNTER — Encounter: Payer: Self-pay | Admitting: Adult Health

## 2015-06-08 ENCOUNTER — Other Ambulatory Visit (HOSPITAL_COMMUNITY)
Admission: RE | Admit: 2015-06-08 | Discharge: 2015-06-08 | Disposition: A | Discharge status: Home / Self Care | Payer: BLUE CROSS/BLUE SHIELD | Source: Ambulatory Visit | Attending: Adult Health | Admitting: Adult Health

## 2015-06-08 VITALS — BP 132/86 | HR 80 | Ht 61.5 in | Wt 227.0 lb

## 2015-06-08 DIAGNOSIS — Z1151 Encounter for screening for human papillomavirus (HPV): Secondary | ICD-10-CM | POA: Diagnosis present

## 2015-06-08 DIAGNOSIS — E7800 Pure hypercholesterolemia, unspecified: Secondary | ICD-10-CM | POA: Insufficient documentation

## 2015-06-08 DIAGNOSIS — F329 Major depressive disorder, single episode, unspecified: Secondary | ICD-10-CM

## 2015-06-08 DIAGNOSIS — F32A Depression, unspecified: Secondary | ICD-10-CM | POA: Insufficient documentation

## 2015-06-08 DIAGNOSIS — Z01419 Encounter for gynecological examination (general) (routine) without abnormal findings: Secondary | ICD-10-CM | POA: Insufficient documentation

## 2015-06-08 DIAGNOSIS — Z1212 Encounter for screening for malignant neoplasm of rectum: Secondary | ICD-10-CM

## 2015-06-08 DIAGNOSIS — Z3041 Encounter for surveillance of contraceptive pills: Secondary | ICD-10-CM

## 2015-06-08 HISTORY — DX: Pure hypercholesterolemia, unspecified: E78.00

## 2015-06-08 LAB — HEMOCCULT GUIAC POC 1CARD (OFFICE): FECAL OCCULT BLD: NEGATIVE

## 2015-06-08 NOTE — Patient Instructions (Signed)
Physical in 1 year Mammogram now(past due) and yearly Labs at work  Colonoscopy per GI

## 2015-06-08 NOTE — Progress Notes (Signed)
Patient ID: SHEMAIAH ROUND, female   DOB: [birth year], 49 y.o.   MRN: [id number] History of Present Illness: Migdalia is a 49 year old white female, married in for well woman gyn exam and pap. Last pap 08/30/12 normal with negative HPV.  Current Medications, Allergies, Past Medical History, Past Surgical History, Family History and Social History were reviewed in [employer] record.     Review of Systems: Patient denies any headaches, hearing loss, fatigue, blurred vision, shortness of breath, chest pain, abdominal pain, problems with bowel movements, urination, or intercourse. No joint pain or mood swings.Has trouble sleeping at times, but went from 12 hour nights to second shift, declines any meds.    Physical Exam:BP 132/86 mmHg  Pulse 80  Ht 5' 1.5" (1.562 m)  Wt 227 lb (102.967 kg)  BMI 42.20 kg/m2  LMP 04/26/2015 General:  Well developed, well nourished, no acute distress Skin:  Warm and dry Neck:  Midline trachea, normal thyroid, good ROM, no lymphadenopathy Lungs; Clear to auscultation bilaterally Breast:  No dominant palpable mass, retraction, or nipple discharge Cardiovascular: Regular rate and rhythm Abdomen:  Soft, non tender, no hepatosplenomegaly Pelvic:  External genitalia is normal in appearance, no lesions.  The vagina is normal in appearance. Urethra has no lesions or masses. The cervix is bulbous, pap with HPV performed.  Uterus is felt to be normal size, shape, and contour.  No adnexal masses or tenderness noted.Bladder is non tender, no masses felt. Rectal: Good sphincter tone, no polyps, or hemorrhoids felt.  Hemoccult negative. Extremities/musculoskeletal:  No swelling or varicosities noted, no clubbing or cyanosis Psych:  No mood changes, alert and cooperative,seems happy Will stay on OCs til 51 then off 1 month and check FSH.  Impression: Well woman gyn exam and pap Contraceptive management Depression Elevated cholesterol      Plan: Physical in 1 year,pap in 3 if normal with negative HPV Get mammogram now, is past due and yearly Labs at work Colonoscopy per Dr Oneida Alar  Continue meds, has refills, call if refills needed

## 2015-06-09 ENCOUNTER — Other Ambulatory Visit: Payer: Self-pay | Admitting: Adult Health

## 2015-06-09 DIAGNOSIS — Z1231 Encounter for screening mammogram for malignant neoplasm of breast: Secondary | ICD-10-CM

## 2015-06-09 LAB — CYTOLOGY - PAP

## 2015-06-15 ENCOUNTER — Telehealth: Payer: Self-pay | Admitting: Orthopedic Surgery

## 2015-06-15 NOTE — Telephone Encounter (Signed)
i ve called several times no answer   Can you call her and see if you can get her

## 2015-06-15 NOTE — Telephone Encounter (Signed)
Patient is calling requesting results of MRI of Knee done on 06/01/15, please advise?

## 2015-06-16 ENCOUNTER — Ambulatory Visit (HOSPITAL_COMMUNITY)
Admission: RE | Admit: 2015-06-16 | Discharge: 2015-06-16 | Disposition: A | Discharge status: Home / Self Care | Payer: BLUE CROSS/BLUE SHIELD | Source: Ambulatory Visit | Attending: Adult Health | Admitting: Adult Health

## 2015-06-16 DIAGNOSIS — R928 Other abnormal and inconclusive findings on diagnostic imaging of breast: Secondary | ICD-10-CM | POA: Diagnosis not present

## 2015-06-16 DIAGNOSIS — Z1231 Encounter for screening mammogram for malignant neoplasm of breast: Secondary | ICD-10-CM

## 2015-06-17 ENCOUNTER — Other Ambulatory Visit: Payer: Self-pay | Admitting: Adult Health

## 2015-06-17 ENCOUNTER — Telehealth: Payer: Self-pay | Admitting: Adult Health

## 2015-06-17 DIAGNOSIS — R928 Other abnormal and inconclusive findings on diagnostic imaging of breast: Secondary | ICD-10-CM

## 2015-06-17 NOTE — Telephone Encounter (Signed)
Called patient, no answer, no voicemail set up.

## 2015-06-17 NOTE — Telephone Encounter (Signed)
Pt aware of mammogram and need for further imaging

## 2015-06-22 ENCOUNTER — Other Ambulatory Visit: Payer: Self-pay | Admitting: Adult Health

## 2015-06-22 ENCOUNTER — Ambulatory Visit (HOSPITAL_COMMUNITY)
Admission: RE | Admit: 2015-06-22 | Discharge: 2015-06-22 | Disposition: A | Discharge status: Home / Self Care | Payer: BLUE CROSS/BLUE SHIELD | Source: Ambulatory Visit | Attending: Adult Health | Admitting: Adult Health

## 2015-06-22 DIAGNOSIS — R928 Other abnormal and inconclusive findings on diagnostic imaging of breast: Secondary | ICD-10-CM | POA: Diagnosis present

## 2015-06-23 NOTE — Telephone Encounter (Signed)
Called patient, no answer, no option to leave vm

## 2015-07-05 ENCOUNTER — Encounter: Payer: Self-pay | Admitting: Orthopedic Surgery

## 2015-07-05 ENCOUNTER — Ambulatory Visit (INDEPENDENT_AMBULATORY_CARE_PROVIDER_SITE_OTHER): Payer: BLUE CROSS/BLUE SHIELD | Admitting: Orthopedic Surgery

## 2015-07-05 VITALS — BP 121/88 | Ht 61.5 in | Wt 227.0 lb

## 2015-07-05 DIAGNOSIS — S83241D Other tear of medial meniscus, current injury, right knee, subsequent encounter: Secondary | ICD-10-CM | POA: Diagnosis not present

## 2015-07-05 NOTE — Addendum Note (Signed)
Addended by: Baldomero Lamy B on: 07/05/2015 04:57 PM   Modules accepted: Orders

## 2015-07-05 NOTE — Patient Instructions (Addendum)
Surgery RIGHT KNEE ARTHROSCOPY WITH MEDIAL MENISECTOMY 07/23/15  OUT OF WORK 4 WEEKS AFTER SURGERY

## 2015-07-05 NOTE — Progress Notes (Signed)
MRI follow-up  The patient has a medial meniscal tear. She will need arthroscopy of the right knee and partial medial meniscectomy. She also has some other changes on MRI which will address at surgery if we can do them with minimal insult to the joint  Surgery explained risks benefits explained patient would need to work 4 weeks after surgery as she stands on her feet for long periods of time. We will do the history and physical at the time near surgery .

## 2015-07-08 ENCOUNTER — Telehealth: Payer: Self-pay | Admitting: Orthopedic Surgery

## 2015-07-08 NOTE — Telephone Encounter (Signed)
Regarding out-patient surgery scheduled at [facility], [place], [phone], contacted [facility]; per Marcelo Baldy I, no prior authorization is required. His name and today's date, 07/08/15, 1:06pm, for reference.

## 2015-07-19 NOTE — Patient Instructions (Addendum)
Pamela Roy  07/19/2015     @PREFPERIOPPHARMACY @   Your procedure is scheduled on 07/23/2015.  Report to [facility] at  9:30 A.M.  Call this number if you have problems the morning of surgery:  [phone]   Remember:  Do not eat food or drink liquids after midnight.  Take these medicines the morning of surgery with A SIP OF WATER Wellbutrin, Celexa, Mobic, Protonix   Do not wear jewelry, make-up or nail polish.  Do not wear lotions, powders, or perfumes.  You may wear deodorant.  Do not shave 48 hours prior to surgery.  Men may shave face and neck.  Do not bring valuables to the hospital.  [facility] is not responsible for any belongings or valuables.  Contacts, dentures or bridgework may not be worn into surgery.  Leave your suitcase in the car.  After surgery it may be brought to your room.  For patients admitted to the hospital, discharge time will be determined by your treatment team.  Patients discharged the day of surgery will not be allowed to drive home.    Please read over the following fact sheets that you were given. Surgical Site Infection Prevention and Anesthesia Post-op Instructions       PATIENT INSTRUCTIONS POST-ANESTHESIA  IMMEDIATELY FOLLOWING SURGERY:  Do not drive or operate machinery for the first twenty four hours after surgery.  Do not make any important decisions for twenty four hours after surgery or while taking narcotic pain medications or sedatives.  If you develop intractable nausea and vomiting or a severe headache please notify your doctor immediately.  FOLLOW-UP:  Please make an appointment with your surgeon as instructed. You do not need to follow up with anesthesia unless specifically instructed to do so.  WOUND CARE INSTRUCTIONS (if applicable):  Keep a dry clean dressing on the anesthesia/puncture wound site if there is drainage.  Once the wound has quit draining you may leave it open to air.  Generally you should leave the bandage  intact for twenty four hours unless there is drainage.  If the epidural site drains for more than 36-48 hours please call the anesthesia department.  QUESTIONS?:  Please feel free to call your physician or the hospital operator if you have any questions, and they will be happy to assist you.      Arthroscopic Procedure, Knee An arthroscopic procedure can find what is wrong with your knee. PROCEDURE Arthroscopy is a surgical technique that allows your orthopedic surgeon to diagnose and treat your knee injury with accuracy. They will look into your knee through a small instrument. This is almost like a small (pencil sized) telescope. Because arthroscopy affects your knee less than open knee surgery, you can anticipate a more rapid recovery. Taking an active role by following your caregiver's instructions will help with rapid and complete recovery. Use crutches, rest, elevation, ice, and knee exercises as instructed. The length of recovery depends on various factors including type of injury, age, physical condition, medical conditions, and your rehabilitation. Your knee is the joint between the large bones (femur and tibia) in your leg. Cartilage covers these bone ends which are smooth and slippery and allow your knee to bend and move smoothly. Two menisci, thick, semi-lunar shaped pads of cartilage which form a rim inside the joint, help absorb shock and stabilize your knee. Ligaments bind the bones together and support your knee joint. Muscles move the joint, help support your knee, and take stress off the joint  itself. Because of this all programs and physical therapy to rehabilitate an injured or repaired knee require rebuilding and strengthening your muscles. AFTER THE PROCEDURE  After the procedure, you will be moved to a recovery area until most of the effects of the medication have worn off. Your caregiver will discuss the test results with you.  Only take over-the-counter or prescription  medicines for pain, discomfort, or fever as directed by your caregiver. SEEK MEDICAL CARE IF:   You have increased bleeding from your wounds.  You see redness, swelling, or have increasing pain in your wounds.  You have pus coming from your wound.  You have an oral temperature above 102 F (38.9 C).  You notice a bad smell coming from the wound or dressing.  You have severe pain with any motion of your knee. SEEK IMMEDIATE MEDICAL CARE IF:   You develop a rash.  You have difficulty breathing.  You have any allergic problems. Document Released: 10/06/2000 Document Revised: 01/01/2012 Document Reviewed: 04/29/2008 [facility] Patient Information 2015 [place], [place]. This information is not intended to replace advice given to you by your health care Pamela Roy. Make sure you discuss any questions you have with your health care Pamela Roy.

## 2015-07-20 ENCOUNTER — Encounter (HOSPITAL_COMMUNITY)
Admission: RE | Admit: 2015-07-20 | Discharge: 2015-07-20 | Disposition: A | Discharge status: Home / Self Care | Payer: BLUE CROSS/BLUE SHIELD | Source: Ambulatory Visit | Attending: Orthopedic Surgery | Admitting: Orthopedic Surgery

## 2015-07-20 ENCOUNTER — Encounter (HOSPITAL_COMMUNITY): Payer: Self-pay

## 2015-07-20 DIAGNOSIS — F329 Major depressive disorder, single episode, unspecified: Secondary | ICD-10-CM | POA: Diagnosis not present

## 2015-07-20 DIAGNOSIS — Z79899 Other long term (current) drug therapy: Secondary | ICD-10-CM | POA: Diagnosis not present

## 2015-07-20 DIAGNOSIS — K219 Gastro-esophageal reflux disease without esophagitis: Secondary | ICD-10-CM | POA: Diagnosis not present

## 2015-07-20 DIAGNOSIS — Z6835 Body mass index (BMI) 35.0-35.9, adult: Secondary | ICD-10-CM | POA: Diagnosis not present

## 2015-07-20 DIAGNOSIS — M23203 Derangement of unspecified medial meniscus due to old tear or injury, right knee: Secondary | ICD-10-CM | POA: Diagnosis present

## 2015-07-20 DIAGNOSIS — E669 Obesity, unspecified: Secondary | ICD-10-CM | POA: Diagnosis not present

## 2015-07-20 DIAGNOSIS — M23221 Derangement of posterior horn of medial meniscus due to old tear or injury, right knee: Secondary | ICD-10-CM | POA: Diagnosis not present

## 2015-07-20 DIAGNOSIS — E780 Pure hypercholesterolemia: Secondary | ICD-10-CM | POA: Diagnosis not present

## 2015-07-20 HISTORY — DX: Anxiety disorder, unspecified: F41.9

## 2015-07-20 HISTORY — DX: Unspecified osteoarthritis, unspecified site: M19.90

## 2015-07-20 HISTORY — DX: Gastro-esophageal reflux disease without esophagitis: K21.9

## 2015-07-20 LAB — CBC WITH DIFFERENTIAL/PLATELET
BASOS PCT: 0 %
Basophils Absolute: 0 10*3/uL (ref 0.0–0.1)
EOS ABS: 0.3 10*3/uL (ref 0.0–0.7)
EOS PCT: 2 %
HCT: 38.7 % (ref 36.0–46.0)
HEMOGLOBIN: 13 g/dL (ref 12.0–15.0)
LYMPHS ABS: 3.3 10*3/uL (ref 0.7–4.0)
Lymphocytes Relative: 29 %
MCH: 29.8 pg (ref 26.0–34.0)
MCHC: 33.6 g/dL (ref 30.0–36.0)
MCV: 88.8 fL (ref 78.0–100.0)
MONO ABS: 0.8 10*3/uL (ref 0.1–1.0)
MONOS PCT: 7 %
NEUTROS PCT: 62 %
Neutro Abs: 7.2 10*3/uL (ref 1.7–7.7)
PLATELETS: 334 10*3/uL (ref 150–400)
RBC: 4.36 MIL/uL (ref 3.87–5.11)
RDW: 13.8 % (ref 11.5–15.5)
WBC: 11.6 10*3/uL — ABNORMAL HIGH (ref 4.0–10.5)

## 2015-07-20 LAB — HCG, SERUM, QUALITATIVE: PREG SERUM: NEGATIVE

## 2015-07-20 LAB — BASIC METABOLIC PANEL
Anion gap: 9 (ref 5–15)
BUN: 13 mg/dL (ref 6–20)
CALCIUM: 8.6 mg/dL — AB (ref 8.9–10.3)
CO2: 27 mmol/L (ref 22–32)
CREATININE: 0.89 mg/dL (ref 0.44–1.00)
Chloride: 101 mmol/L (ref 101–111)
Glucose, Bld: 92 mg/dL (ref 65–99)
Potassium: 3.7 mmol/L (ref 3.5–5.1)
SODIUM: 137 mmol/L (ref 135–145)

## 2015-07-20 NOTE — Interval H&P Note (Deleted)
History and Physical Interval Note:  07/20/2015 1:42 PM  Pamela Roy  has presented today for surgery, with the diagnosis of medial meniscus tear, right  The various methods of treatment have been discussed with the patient and family. After consideration of risks, benefits and other options for treatment, the patient has consented to  Procedure(s): KNEE ARTHROSCOPY WITH LATERAL  MENISECTOMY (Right) as a surgical intervention .  The patient's history has been reviewed, patient examined, no change in status, stable for surgery.  I have reviewed the patient's chart and labs.  Questions were answered to the patient's satisfaction.     Arther Abbott

## 2015-07-20 NOTE — H&P (Deleted)
Patient ID: Pamela Roy, female   DOB: [birth year], 49 y.o.   MRN: [id number]  New problem    Chief Complaint   Patient presents with   .  Knee Pain       right knee pain x 2 months, no known injury      Pamela Roy is a 49 y.o. female.   HPI This is a 49 year old female who presents with pain in her right knee since the end of April. She had a traumatic onset of pain and swelling of the right knee associated with catching. The pain is described as sharp and throbbing. It's worse with activity is worse at night. She rates the pain 8 out of 10. She took Aleve over-the-counter and she tends to stay off of the knee to get relief. No previous imaging has been done. Review of Systems Currently being treated for UTI on July 4 she has some anxiety she has not had any fatigue fever or chills previous joint pain back pain or swelling of the joint    Past Medical History   Diagnosis  Date   .  Hypercholesteremia     .  Depression     .  PONV (postoperative nausea and vomiting)     .  Helicobacter pylori gastritis     .  Menstrual headache  09/30/2013   .  Contraceptive management  09/30/2013       Past Surgical History   Procedure  Laterality  Date   .  Cholecystectomy       .  Carpal tunnel release           left   .  Colonoscopy    10/14/2012       GOT:LXBWI internal hemorrhoids/Mild diverticulosis/Two sessile polyps ranging between 3-66mm in size. HYPERPLASTIC POLYPS. SCREENING in 2023.    Marland Kitchen  Esophagogastroduodenoscopy    10/14/2012       OMB:TDHRCBUL inflammation was found in the duodenal bulb/Non-erosive gastritis (inflammation) +H.PYLORI       Family History   Problem  Relation  Age of Onset   .  Colon cancer  Neg Hx     .  Thyroid disease  Mother     .  Hyperlipidemia  Mother     .  Hyperlipidemia  Maternal Grandmother     .  Heart disease  Maternal Grandmother     .  Heart disease  Maternal Grandfather       Social History History   Substance Use Topics   .  Smoking  status:  Never Smoker    .  Smokeless tobacco:  Never Used   .  Alcohol Use:  No     No Known Allergies    Current Outpatient Prescriptions   Medication  Sig  Dispense  Refill   .  acetaminophen (TYLENOL) 500 MG tablet  Take 500 mg by mouth every 6 (six) hours as needed for pain.       Marland Kitchen  atorvastatin (LIPITOR) 40 MG tablet  Take 40 mg by mouth daily.       Marland Kitchen  buPROPion (WELLBUTRIN XL) 300 MG 24 hr tablet  Take 300 mg by mouth every morning.        .  citalopram (CELEXA) 20 MG tablet  TAKE ONE TABLET BY MOUTH ONCE DAILY  90 tablet  0   .  meloxicam (MOBIC) 15 MG tablet  Take 15 mg by mouth daily.       Marland Kitchen  pantoprazole (PROTONIX) 40 MG tablet  Take 1 tablet (40 mg total) by mouth daily. Take 30 minutes before first meal of day.  30 tablet  11   .  VIORELE 0.15-0.02/0.01 MG (21/5) tablet  TAKE 1 TABLET DAILY  3 tablet  3      No current facility-administered medications for this visit.        Physical Exam Blood pressure 126/77, height 5\' 2"  (1.575 m), weight 223 lb (101.152 kg).   Body mass index is 40.78 kg/(m^2).  Physical Exam The patient is well developed well nourished and well groomed. Orientation to person place and time is normal   Mood is pleasant. Ambulatory status walking with no support    Upper extremities are normal  Left knee full range of motion. Fill stable. Good muscle tone strength is normal scans intact normal reflexes.  Right knee medial joint line tenderness small effusion painful range of motion positive McMurray sign positive hyperextension test no ligamentous instability muscle strength is normal scans intact reflexes normal pulses are normal  Data Reviewed And x-ray was ordered and the reading of that is   mild to moderate joint space narrowing medial compartment mild to moderate osteoarthritis  MRI LEFT KNEE WITHOUT CONTRAST:   Technique: Multiplanar, multisequence MR imaging of the knee was performed following the standard protocol. No intravenous  contrast was administered.   Comparison: None.   Findings: No acute bony findings. There are mild tricompartmental degenerative changes with minimal chondromalacia.    There are changes of patellar tendinitis proximally with thickening, decreased signal intensity and fluid and edema surrounding the tendon. There is also associated marrow edema in the patella. There is also surrounding edema in the anterior compartment fat. This is likely causing the patient's anterior knee pain.    The cruciate and collateral ligaments are intact.    Menisci demonstrate normal morphology. There is a small free edge tear involving the posterior horn of the lateral meniscus near the meniscal root. This is probably not clinically significant. There is no displacement. There is type 2 signal change involving the posterior horn of the medial meniscus, but no discrete tear.    No joint effusion. Tiny Baker's cyst. Retinacular structures are normal. Quadriceps tendon is intact.    IMPRESSION:    1. Fairly significant proximal patellar tendinitis with associated marrow edema in the distal patella and edema in the surrounding anterior knee compartment fat. There is associated mild chondromalacia involving the patellofemoral joint.   2. Small free edge tear involving the posterior horn in the lateral meniscus, near the meniscal root, which is probably not significant. Type 2 signal change in the posterior horn of the medial meniscus.    3. No joint effusion. Tiny Baker's cyst.   Provider: Mauri Pole, Juanda Chance  Left knee torn lateral meniscus. The patient has decided to proceed with arthroscopic surgery due to ongoing pain and difficulties with certain activities of daily living. I counseled her on the possibilities of surgery versus no surgery versus actual knee replacement and we decided to go ahead with arthroscopic surgery to try to clean things up and address the lateral meniscal tear  Arthroscopic lateral  meniscectomy left knee

## 2015-07-22 ENCOUNTER — Telehealth: Payer: Self-pay | Admitting: Orthopedic Surgery

## 2015-07-22 NOTE — Telephone Encounter (Signed)
Patient is asking if she will receive pain medication tomorrow after surgery and if so she wanted to go ahead and get it today to already have it at home, please advise?

## 2015-07-23 ENCOUNTER — Encounter (HOSPITAL_COMMUNITY)
Admission: RE | Disposition: A | Discharge status: Home / Self Care | Payer: Self-pay | Source: Ambulatory Visit | Attending: Orthopedic Surgery

## 2015-07-23 ENCOUNTER — Ambulatory Visit (HOSPITAL_COMMUNITY)
Admission: RE | Admit: 2015-07-23 | Discharge: 2015-07-23 | Disposition: A | Discharge status: Home / Self Care | Payer: BLUE CROSS/BLUE SHIELD | Source: Ambulatory Visit | Attending: Orthopedic Surgery | Admitting: Orthopedic Surgery

## 2015-07-23 ENCOUNTER — Encounter (HOSPITAL_COMMUNITY): Payer: Self-pay | Admitting: *Deleted

## 2015-07-23 ENCOUNTER — Ambulatory Visit (HOSPITAL_COMMUNITY): Payer: BLUE CROSS/BLUE SHIELD | Admitting: Anesthesiology

## 2015-07-23 DIAGNOSIS — M23221 Derangement of posterior horn of medial meniscus due to old tear or injury, right knee: Secondary | ICD-10-CM | POA: Insufficient documentation

## 2015-07-23 DIAGNOSIS — E780 Pure hypercholesterolemia: Secondary | ICD-10-CM | POA: Insufficient documentation

## 2015-07-23 DIAGNOSIS — Z79899 Other long term (current) drug therapy: Secondary | ICD-10-CM | POA: Insufficient documentation

## 2015-07-23 DIAGNOSIS — E669 Obesity, unspecified: Secondary | ICD-10-CM | POA: Insufficient documentation

## 2015-07-23 DIAGNOSIS — F329 Major depressive disorder, single episode, unspecified: Secondary | ICD-10-CM | POA: Insufficient documentation

## 2015-07-23 DIAGNOSIS — Z6835 Body mass index (BMI) 35.0-35.9, adult: Secondary | ICD-10-CM | POA: Insufficient documentation

## 2015-07-23 DIAGNOSIS — S83249A Other tear of medial meniscus, current injury, unspecified knee, initial encounter: Secondary | ICD-10-CM | POA: Insufficient documentation

## 2015-07-23 DIAGNOSIS — K219 Gastro-esophageal reflux disease without esophagitis: Secondary | ICD-10-CM | POA: Insufficient documentation

## 2015-07-23 DIAGNOSIS — S83241D Other tear of medial meniscus, current injury, right knee, subsequent encounter: Secondary | ICD-10-CM | POA: Diagnosis not present

## 2015-07-23 HISTORY — PX: KNEE ARTHROSCOPY WITH MEDIAL MENISECTOMY: SHX5651

## 2015-07-23 SURGERY — ARTHROSCOPY, KNEE, WITH MEDIAL MENISCECTOMY
Anesthesia: General | Site: Knee | Laterality: Right

## 2015-07-23 MED ORDER — ONDANSETRON HCL 4 MG/2ML IJ SOLN
4.0000 mg | Freq: Once | INTRAMUSCULAR | Status: DC | PRN
Start: 2015-07-23 — End: 2015-07-23

## 2015-07-23 MED ORDER — BUPIVACAINE-EPINEPHRINE (PF) 0.5% -1:200000 IJ SOLN
INTRAMUSCULAR | Status: DC | PRN
  Administered 2015-07-23: 60 mL via PERINEURAL

## 2015-07-23 MED ORDER — BUPIVACAINE-EPINEPHRINE (PF) 0.5% -1:200000 IJ SOLN
INTRAMUSCULAR | Status: AC
  Filled 2015-07-23: qty 60

## 2015-07-23 MED ORDER — CHLORHEXIDINE GLUCONATE 4 % EX LIQD: 60.0000 mL | Freq: Once | CUTANEOUS | Status: DC

## 2015-07-23 MED ORDER — PROMETHAZINE HCL 12.5 MG PO TABS: 12.5000 mg | ORAL_TABLET | Freq: Four times a day (QID) | ORAL | Status: DC | PRN

## 2015-07-23 MED ORDER — FENTANYL CITRATE (PF) 100 MCG/2ML IJ SOLN: 25.0000 ug | INTRAMUSCULAR | Status: DC | PRN

## 2015-07-23 MED ORDER — SCOPOLAMINE 1 MG/3DAYS TD PT72: 1.0000 | MEDICATED_PATCH | Freq: Once | TRANSDERMAL | Status: DC

## 2015-07-23 MED ORDER — MIDAZOLAM HCL 2 MG/2ML IJ SOLN
INTRAMUSCULAR | Status: AC
  Filled 2015-07-23: qty 2

## 2015-07-23 MED ORDER — FENTANYL CITRATE (PF) 100 MCG/2ML IJ SOLN
INTRAMUSCULAR | Status: AC
  Filled 2015-07-23: qty 4

## 2015-07-23 MED ORDER — SODIUM CHLORIDE 0.9 % IR SOLN
Status: DC | PRN
  Administered 2015-07-23: 1000 mL

## 2015-07-23 MED ORDER — CEFAZOLIN SODIUM-DEXTROSE 2-3 GM-% IV SOLR
2.0000 g | INTRAVENOUS | Status: AC
  Administered 2015-07-23: 2 g via INTRAVENOUS

## 2015-07-23 MED ORDER — EPINEPHRINE HCL 1 MG/ML IJ SOLN
INTRAMUSCULAR | Status: AC
  Filled 2015-07-23: qty 3

## 2015-07-23 MED ORDER — LIDOCAINE HCL (CARDIAC) 10 MG/ML IV SOLN
INTRAVENOUS | Status: DC | PRN
  Administered 2015-07-23: 50 mg via INTRAVENOUS

## 2015-07-23 MED ORDER — HYDROCODONE-ACETAMINOPHEN 7.5-325 MG PO TABS: 1.0000 | ORAL_TABLET | Freq: Four times a day (QID) | ORAL | Status: DC | PRN

## 2015-07-23 MED ORDER — LACTATED RINGERS IV SOLN
INTRAVENOUS | Status: DC
  Administered 2015-07-23: 10:00:00 via INTRAVENOUS

## 2015-07-23 MED ORDER — SCOPOLAMINE 1 MG/3DAYS TD PT72
1.0000 | MEDICATED_PATCH | Freq: Once | TRANSDERMAL | Status: DC
  Administered 2015-07-23: 1.5 mg via TRANSDERMAL

## 2015-07-23 MED ORDER — ARTIFICIAL TEARS OP OINT
TOPICAL_OINTMENT | OPHTHALMIC | Status: AC
  Filled 2015-07-23: qty 3.5

## 2015-07-23 MED ORDER — CEFAZOLIN SODIUM-DEXTROSE 2-3 GM-% IV SOLR
INTRAVENOUS | Status: AC
  Filled 2015-07-23: qty 50

## 2015-07-23 MED ORDER — PROPOFOL 10 MG/ML IV BOLUS
INTRAVENOUS | Status: AC
  Filled 2015-07-23: qty 20

## 2015-07-23 MED ORDER — SCOPOLAMINE 1 MG/3DAYS TD PT72
MEDICATED_PATCH | TRANSDERMAL | Status: AC
  Filled 2015-07-23: qty 1

## 2015-07-23 MED ORDER — DEXAMETHASONE SODIUM PHOSPHATE 4 MG/ML IJ SOLN
INTRAMUSCULAR | Status: AC
  Filled 2015-07-23: qty 2

## 2015-07-23 MED ORDER — MIDAZOLAM HCL 2 MG/2ML IJ SOLN
1.0000 mg | INTRAMUSCULAR | Status: DC | PRN
  Administered 2015-07-23: 2 mg via INTRAVENOUS

## 2015-07-23 MED ORDER — FENTANYL CITRATE (PF) 100 MCG/2ML IJ SOLN
INTRAMUSCULAR | Status: DC | PRN
  Administered 2015-07-23: 50 ug via INTRAVENOUS
  Administered 2015-07-23: 25 ug via INTRAVENOUS
  Administered 2015-07-23: 50 ug via INTRAVENOUS
  Administered 2015-07-23: 25 ug via INTRAVENOUS

## 2015-07-23 MED ORDER — PROPOFOL 10 MG/ML IV BOLUS
INTRAVENOUS | Status: DC | PRN
  Administered 2015-07-23: 50 mg via INTRAVENOUS
  Administered 2015-07-23: 180 mg via INTRAVENOUS
  Administered 2015-07-23: 20 mg via INTRAVENOUS

## 2015-07-23 MED ORDER — SODIUM CHLORIDE 0.9 % IR SOLN
Status: DC | PRN
  Administered 2015-07-23 (×3): 3000 mL

## 2015-07-23 MED ORDER — LIDOCAINE HCL (PF) 1 % IJ SOLN
INTRAMUSCULAR | Status: AC
  Filled 2015-07-23: qty 5

## 2015-07-23 SURGICAL SUPPLY — 52 items
ARTHROWAND PARAGON T2 (SURGICAL WAND)
BAG HAMPER (MISCELLANEOUS) ×2 IMPLANT
BANDAGE ELASTIC 6 VELCRO NS (GAUZE/BANDAGES/DRESSINGS) ×2 IMPLANT
BLADE AGGRESSIVE PLUS 4.0 (BLADE) ×2 IMPLANT
CHLORAPREP W/TINT 26ML (MISCELLANEOUS) ×3 IMPLANT
CLOTH BEACON ORANGE TIMEOUT ST (SAFETY) ×2 IMPLANT
COOLER CRYO IC GRAV AND TUBE (ORTHOPEDIC SUPPLIES) ×2 IMPLANT
CUFF CRYO KNEE LG 20X31 COOLER (ORTHOPEDIC SUPPLIES) IMPLANT
CUFF CRYO KNEE18X23 MED (MISCELLANEOUS) ×1 IMPLANT
CUFF TOURNIQUET SINGLE 34IN LL (TOURNIQUET CUFF) ×1 IMPLANT
CUFF TOURNIQUET SINGLE 44IN (TOURNIQUET CUFF) IMPLANT
CUTTER ANGLED DBL BITE 4.5 (BURR) IMPLANT
DECANTER SPIKE VIAL GLASS SM (MISCELLANEOUS) ×4 IMPLANT
GAUZE SPONGE 4X4 12PLY STRL (GAUZE/BANDAGES/DRESSINGS) ×2 IMPLANT
GAUZE SPONGE 4X4 16PLY XRAY LF (GAUZE/BANDAGES/DRESSINGS) ×2 IMPLANT
GAUZE XEROFORM 5X9 LF (GAUZE/BANDAGES/DRESSINGS) ×2 IMPLANT
GLOVE SKINSENSE NS SZ8.0 LF (GLOVE) ×1
GLOVE SKINSENSE STRL SZ8.0 LF (GLOVE) ×1 IMPLANT
GLOVE SS N UNI LF 8.5 STRL (GLOVE) ×2 IMPLANT
GOWN STRL REUS W/ TWL LRG LVL3 (GOWN DISPOSABLE) ×1 IMPLANT
GOWN STRL REUS W/TWL LRG LVL3 (GOWN DISPOSABLE) ×2
GOWN STRL REUS W/TWL XL LVL3 (GOWN DISPOSABLE) ×2 IMPLANT
HLDR LEG FOAM (MISCELLANEOUS) ×1 IMPLANT
IV NS IRRIG 3000ML ARTHROMATIC (IV SOLUTION) ×5 IMPLANT
KIT BLADEGUARD II DBL (SET/KITS/TRAYS/PACK) ×2 IMPLANT
KIT ROOM TURNOVER AP CYSTO (KITS) ×2 IMPLANT
LEG HOLDER FOAM (MISCELLANEOUS) ×1
MANIFOLD NEPTUNE II (INSTRUMENTS) ×2 IMPLANT
MARKER SKIN DUAL TIP RULER LAB (MISCELLANEOUS) ×2 IMPLANT
NDL HYPO 18GX1.5 BLUNT FILL (NEEDLE) ×1 IMPLANT
NDL HYPO 21X1.5 SAFETY (NEEDLE) ×1 IMPLANT
NDL SPNL 18GX3.5 QUINCKE PK (NEEDLE) ×1 IMPLANT
NEEDLE HYPO 18GX1.5 BLUNT FILL (NEEDLE) ×2 IMPLANT
NEEDLE HYPO 21X1.5 SAFETY (NEEDLE) ×2 IMPLANT
NEEDLE SPNL 18GX3.5 QUINCKE PK (NEEDLE) ×2 IMPLANT
NS IRRIG 1000ML POUR BTL (IV SOLUTION) ×2 IMPLANT
PACK ARTHRO LIMB DRAPE STRL (MISCELLANEOUS) ×2 IMPLANT
PAD ABD 5X9 TENDERSORB (GAUZE/BANDAGES/DRESSINGS) ×2 IMPLANT
PAD ARMBOARD 7.5X6 YLW CONV (MISCELLANEOUS) ×2 IMPLANT
PADDING CAST COTTON 6X4 STRL (CAST SUPPLIES) ×2 IMPLANT
PADDING WEBRIL 6 STERILE (GAUZE/BANDAGES/DRESSINGS) ×1 IMPLANT
SET ARTHROSCOPY INST (INSTRUMENTS) ×2 IMPLANT
SET ARTHROSCOPY PUMP TUBE (IRRIGATION / IRRIGATOR) ×2 IMPLANT
SET BASIN LINEN APH (SET/KITS/TRAYS/PACK) ×2 IMPLANT
SPONGE GAUZE 4X4 12PLY (GAUZE/BANDAGES/DRESSINGS) ×1 IMPLANT
SUT ETHILON 3 0 FSL (SUTURE) ×1 IMPLANT
SYR 30ML LL (SYRINGE) ×2 IMPLANT
SYRINGE 10CC LL (SYRINGE) ×2 IMPLANT
WAND 50 DEG COVAC W/CORD (SURGICAL WAND) ×1 IMPLANT
WAND 90 DEG TURBOVAC W/CORD (SURGICAL WAND) IMPLANT
WAND ARTHRO PARAGON T2 (SURGICAL WAND) IMPLANT
YANKAUER SUCT BULB TIP 10FT TU (MISCELLANEOUS) ×6 IMPLANT

## 2015-07-23 NOTE — Interval H&P Note (Deleted)
History and Physical Interval Note:  07/23/2015 10:18 AM  Pamela Roy  has presented today for surgery, with the diagnosis of medial meniscus tear, right  The various methods of treatment have been discussed with the patient and family. After consideration of risks, benefits and other options for treatment, the patient has consented to  Procedure(s): KNEE ARTHROSCOPY WITH MEDIAL MENISECTOMY (Right) as a surgical intervention .  The patient's history has been reviewed, patient examined, no change in status, stable for surgery.  I have reviewed the patient's chart and labs.  Questions were answered to the patient's satisfaction.     Arther Abbott

## 2015-07-23 NOTE — H&P (Signed)
Author: Carole Civil, MD Service: Orthopedics Author Type: Physician     Filed: 07/20/2015 1:41 PM Note Time: 07/20/2015 1:39 PM Status: Signed    Editor: Carole Civil, MD (Physician)      Expand All Collapse All   Patient ID: Pamela Roy, female   DOB: Feb 28, 1966, 49 y.o.   MRN: [id number]  New problem    Chief Complaint    Patient presents with    .   Knee Pain          right knee pain x 2 months, no known injury       Pamela Roy is a 49 y.o. female.   HPI This is a 49 year old female who presents with pain in her right knee since the end of April. She had a traumatic onset of pain and swelling of the right knee associated with catching. The pain is described as sharp and throbbing. It's worse with activity is worse at night. She rates the pain 8 out of 10. She took Aleve over-the-counter and she tends to stay off of the knee to get relief. No previous imaging has been done. Review of Systems Currently being treated for UTI on July 4 she has some anxiety she has not had any fatigue fever or chills previous joint pain back pain or swelling of the joint    Past Medical History    Diagnosis   Date    .   Hypercholesteremia       .   Depression       .   PONV (postoperative nausea and vomiting)       .   Helicobacter pylori gastritis       .   Menstrual headache   09/30/2013    .   Contraceptive management   09/30/2013        Past Surgical History    Procedure   Laterality   Date    .   Cholecystectomy          .   Carpal tunnel release                left    .   Colonoscopy      10/14/2012          FWY:OVZCH internal hemorrhoids/Mild diverticulosis/Two sessile polyps ranging between 3-28mm in size. HYPERPLASTIC POLYPS. SCREENING in 2023.     Marland Kitchen   Esophagogastroduodenoscopy      10/14/2012          YIF:OYDXAJOI inflammation was found in the duodenal bulb/Non-erosive gastritis (inflammation) +H.PYLORI        Family History    Problem   Relation   Age of Onset    .    Colon cancer   Neg Hx       .   Thyroid disease   Mother       .   Hyperlipidemia   Mother       .   Hyperlipidemia   Maternal Grandmother       .   Heart disease   Maternal Grandmother       .   Heart disease   Maternal Grandfather         Social History History    Substance Use Topics    .   Smoking status:   Never Smoker     .   Smokeless tobacco:   Never Used    .   Alcohol Use:  No      No Known Allergies    Current Outpatient Prescriptions    Medication   Sig   Dispense   Refill    .   acetaminophen (TYLENOL) 500 MG tablet   Take 500 mg by mouth every 6 (six) hours as needed for pain.          Marland Kitchen   atorvastatin (LIPITOR) 40 MG tablet   Take 40 mg by mouth daily.          Marland Kitchen   buPROPion (WELLBUTRIN XL) 300 MG 24 hr tablet   Take 300 mg by mouth every morning.           .   citalopram (CELEXA) 20 MG tablet   TAKE ONE TABLET BY MOUTH ONCE DAILY   90 tablet   0    .   meloxicam (MOBIC) 15 MG tablet   Take 15 mg by mouth daily.          .   pantoprazole (PROTONIX) 40 MG tablet   Take 1 tablet (40 mg total) by mouth daily. Take 30 minutes before first meal of day.   30 tablet   11    .   VIORELE 0.15-0.02/0.01 MG (21/5) tablet   TAKE 1 TABLET DAILY   3 tablet   3       No current facility-administered medications for this visit.         Physical Exam Blood pressure 126/77, height 5\' 2"  (1.575 m), weight 223 lb (101.152 kg).   Body mass index is 40.78 kg/(m^2).  Physical Exam The patient is well developed well nourished and well groomed. Orientation to person place and time is normal   Mood is pleasant. Ambulatory status walking with no support    Upper extremities are normal   Left knee full range of motion. Fill stable. Good muscle tone strength is normal scans intact normal reflexes.  Right knee medial joint line tenderness small effusion painful range of motion positive McMurray sign positive hyperextension test no ligamentous instability muscle strength is normal  scans intact reflexes normal pulses are normal  Data Reviewed And x-ray was ordered and the reading of that is   mild to moderate joint space narrowing medial compartment mild to moderate osteoarthritis  IMPRESSION: Large radial tear through the root of the posterior horn of medial meniscus. The body of the medial meniscus is extruded peripherally.   Edema in Hoffa's fat off the inferior pole of the lateral patella is consistent with patellar tendon - lateral femoral condyle friction syndrome.     Electronically Signed   By: Inge Rise M.D.   On: 06/01/2015 15:47  Right kneee The patient has decided to proceed with arthroscopic surgery due to ongoing pain and difficulties with certain activities of daily living. I counseled her on the possibilities of surgery versus no surgery versus actual knee replacement and we decided to go ahead with arthroscopic surgery to try to clean things up and address the lateral meniscal tear  Arthroscopic medial menisectomy right knee

## 2015-07-23 NOTE — H&P (Deleted)
Author: Carole Civil, MD Service: Orthopedics Author Type: Physician     Filed: 07/20/2015 1:41 PM Note Time: 07/20/2015 1:39 PM Status: Signed    Editor: Carole Civil, MD (Physician)      Expand All Collapse All   Patient ID: YNEZ EUGENIO, female   DOB: October 16, 1966, 49 y.o.   MRN: [id number]  New problem    Chief Complaint    Patient presents with    .   Knee Pain          right knee pain x 2 months, no known injury       Donnika ROZLYNN LIPPOLD is a 49 y.o. female.   HPI This is a 49 year old female who presents with pain in her right knee since the end of April. She had a traumatic onset of pain and swelling of the right knee associated with catching. The pain is described as sharp and throbbing. It's worse with activity is worse at night. She rates the pain 8 out of 10. She took Aleve over-the-counter and she tends to stay off of the knee to get relief. No previous imaging has been done. Review of Systems Currently being treated for UTI on July 4 she has some anxiety she has not had any fatigue fever or chills previous joint pain back pain or swelling of the joint    Past Medical History    Diagnosis   Date    .   Hypercholesteremia       .   Depression       .   PONV (postoperative nausea and vomiting)       .   Helicobacter pylori gastritis       .   Menstrual headache   09/30/2013    .   Contraceptive management   09/30/2013        Past Surgical History    Procedure   Laterality   Date    .   Cholecystectomy          .   Carpal tunnel release                left    .   Colonoscopy      10/14/2012          PYP:PJKDT internal hemorrhoids/Mild diverticulosis/Two sessile polyps ranging between 3-37mm in size. HYPERPLASTIC POLYPS. SCREENING in 2023.     Marland Kitchen   Esophagogastroduodenoscopy      10/14/2012          OIZ:TIWPYKDX inflammation was found in the duodenal bulb/Non-erosive gastritis (inflammation) +H.PYLORI        Family History    Problem   Relation   Age of Onset    .    Colon cancer   Neg Hx       .   Thyroid disease   Mother       .   Hyperlipidemia   Mother       .   Hyperlipidemia   Maternal Grandmother       .   Heart disease   Maternal Grandmother       .   Heart disease   Maternal Grandfather         Social History History    Substance Use Topics    .   Smoking status:   Never Smoker     .   Smokeless tobacco:   Never Used    .   Alcohol Use:  No      No Known Allergies    Current Outpatient Prescriptions    Medication   Sig   Dispense   Refill    .   acetaminophen (TYLENOL) 500 MG tablet   Take 500 mg by mouth every 6 (six) hours as needed for pain.          Marland Kitchen   atorvastatin (LIPITOR) 40 MG tablet   Take 40 mg by mouth daily.          Marland Kitchen   buPROPion (WELLBUTRIN XL) 300 MG 24 hr tablet   Take 300 mg by mouth every morning.           .   citalopram (CELEXA) 20 MG tablet   TAKE ONE TABLET BY MOUTH ONCE DAILY   90 tablet   0    .   meloxicam (MOBIC) 15 MG tablet   Take 15 mg by mouth daily.          .   pantoprazole (PROTONIX) 40 MG tablet   Take 1 tablet (40 mg total) by mouth daily. Take 30 minutes before first meal of day.   30 tablet   11    .   VIORELE 0.15-0.02/0.01 MG (21/5) tablet   TAKE 1 TABLET DAILY   3 tablet   3       No current facility-administered medications for this visit.         Physical Exam Blood pressure 126/77, height 5\' 2"  (1.575 m), weight 223 lb (101.152 kg).   Body mass index is 40.78 kg/(m^2).  Physical Exam The patient is well developed well nourished and well groomed. Orientation to person place and time is normal   Mood is pleasant. Ambulatory status walking with no support    Upper extremities are normal   Left knee full range of motion. Fill stable. Good muscle tone strength is normal scans intact normal reflexes.  Right knee medial joint line tenderness small effusion painful range of motion positive McMurray sign positive hyperextension test no ligamentous instability muscle strength is normal  scans intact reflexes normal pulses are normal  Data Reviewed And x-ray was ordered and the reading of that is   mild to moderate joint space narrowing medial compartment mild to moderate osteoarthritis  Arthroscopic lateral meniscectomy right knee

## 2015-07-23 NOTE — Brief Op Note (Addendum)
07/23/2015  11:29 AM  PATIENT:  Pamela Roy  49 y.o. female  PRE-OPERATIVE DIAGNOSIS:  medial meniscus tear, right  POST-OPERATIVE DIAGNOSIS:  * same  *  PROCEDURE:  Procedure(s): KNEE ARTHROSCOPY WITH MEDIAL MENISECTOMY (Right)   Compartment grade 3 chondral lesion medial femoral condyle cartilage fragments floating in the joint. Tears of the posterior horn the medial meniscus at the root  The remaining portions of the joint normal. We did note that the patella was subluxated in extension but reduced in flexion  Details of procedure  The site marking and chart update in the preop area. We took her to surgery. She had general anesthesia. She was in the supine position. The right leg was placed in a arthroscopic leg holder the left leg was placed in a well leg holder and padded  After sterile prep and drape and general anesthesia timeout was completed  We started in that with a lateral portal we introduced the scope into the joint and did a diagnostic arthroscopy. I listed the findings under operative findings. We made a medial portal and used a 50 ArthroCare wand to remove the torn meniscal fragment. Stable rim was produced. Chondral lesion was noted and a brief chondroplasty was performed to remove a small piece of cartilage flap at the joint surface.  The joint was irrigated and closed with 3-0 nylon sutures injected with 60 mL of Marcaine with epinephrine. SURGEON:  Surgeon(s) and Role:    * Carole Civil, MD - Primary  PHYSICIAN ASSISTANT:   ASSISTANTS: none   ANESTHESIA:   general  EBL:  Total I/O In: 600 [I.V.:600] Out: -   BLOOD ADMINISTERED:none  DRAINS: none   LOCAL MEDICATIONS USED:  MARCAINE     SPECIMEN:  No Specimen  DISPOSITION OF SPECIMEN:  N/A  COUNTS:  YES  TOURNIQUET:    DICTATION: .Dragon Dictation  PLAN OF CARE: Discharge to home after PACU  PATIENT DISPOSITION:  PACU - hemodynamically stable.   Delay start of Pharmacological VTE  agent (>24hrs) due to surgical blood loss or risk of bleeding: not applicable

## 2015-07-23 NOTE — Anesthesia Procedure Notes (Signed)
Procedure Name: LMA Insertion Date/Time: 07/23/2015 10:45 AM Performed by: Vista Deck Pre-anesthesia Checklist: Patient identified, Patient being monitored, Emergency Drugs available, Timeout performed and Suction available Patient Re-evaluated:Patient Re-evaluated prior to inductionOxygen Delivery Method: Circle System Utilized Preoxygenation: Pre-oxygenation with 100% oxygen Intubation Type: IV induction Ventilation: Mask ventilation without difficulty LMA: LMA inserted LMA Size: 4.0 Number of attempts: 1 Placement Confirmation: positive ETCO2 and breath sounds checked- equal and bilateral Tube secured with: Tape

## 2015-07-23 NOTE — Op Note (Addendum)
07/23/2015  11:29 AM  PATIENT:  Elizebeth Koller  49 y.o. female  PRE-OPERATIVE DIAGNOSIS:  medial meniscus tear, right  POST-OPERATIVE DIAGNOSIS:  * same  *  PROCEDURE:  Procedure(s): KNEE ARTHROSCOPY WITH MEDIAL MENISECTOMY (Right)   Compartment grade 3 chondral lesion medial femoral condyle cartilage fragments floating in the joint. Tears of the posterior horn the medial meniscus at the root  The remaining portions of the joint normal. We did note that the patella was subluxated in extension but reduced in flexion  Details of procedure  The site marking and chart update in the preop area. We took her to surgery. She had general anesthesia. She was in the supine position. The right leg was placed in a arthroscopic leg holder the left leg was placed in a well leg holder and padded  After sterile prep and drape and general anesthesia timeout was completed  We started in that with a lateral portal we introduced the scope into the joint and did a diagnostic arthroscopy. I listed the findings under operative findings. We made a medial portal and used a 50 ArthroCare wand to remove the torn meniscal fragment. Stable rim was produced. Chondral lesion was noted and a brief chondroplasty was performed to remove a small piece of cartilage flap at the joint surface.  The joint was irrigated and closed with 3-0 nylon sutures injected with 60 mL of Marcaine with epinephrine. SURGEON:  Surgeon(s) and Role:    * Carole Civil, MD - Primary  PHYSICIAN ASSISTANT:   ASSISTANTS: none   ANESTHESIA:   general  EBL:  Total I/O In: 600 [I.V.:600] Out: -   BLOOD ADMINISTERED:none  DRAINS: none   LOCAL MEDICATIONS USED:  MARCAINE     SPECIMEN:  No Specimen  DISPOSITION OF SPECIMEN:  N/A  COUNTS:  YES  TOURNIQUET:    DICTATION: .Dragon Dictation  PLAN OF CARE: Discharge to home after PACU  PATIENT DISPOSITION:  PACU - hemodynamically stable.   Delay start of Pharmacological VTE  agent (>24hrs) due to surgical blood loss or risk of bleeding: not applicable

## 2015-07-23 NOTE — Anesthesia Preprocedure Evaluation (Signed)
Anesthesia Evaluation  Patient identified by MRN, date of birth, ID band Patient awake    Reviewed: Allergy & Precautions, NPO status , Patient's Chart, lab work & pertinent test results  History of Anesthesia Complications (+) PONV and AWARENESS UNDER ANESTHESIA  Airway Mallampati: III  TM Distance: >3 FB Neck ROM: Full    Dental  (+) Teeth Intact,    Pulmonary neg pulmonary ROS,    Pulmonary exam normal        Cardiovascular Exercise Tolerance: Poor negative cardio ROS Normal cardiovascular exam     Neuro/Psych  Headaches,    GI/Hepatic GERD  Medicated and Controlled,  Endo/Other  Morbid obesity  Renal/GU   negative genitourinary   Musculoskeletal  (+) Arthritis ,   Abdominal Normal abdominal exam  (+)   Peds  Hematology   Anesthesia Other Findings   Reproductive/Obstetrics negative OB ROS                             Anesthesia Physical Anesthesia Plan  ASA: III  Anesthesia Plan: General   Post-op Pain Management:    Induction: Intravenous  Airway Management Planned: LMA  Additional Equipment:   Intra-op Plan:   Post-operative Plan: Extubation in OR  Informed Consent: I have reviewed the patients History and Physical, chart, labs and discussed the procedure including the risks, benefits and alternatives for the proposed anesthesia with the patient or authorized representative who has indicated his/her understanding and acceptance.   Dental advisory given  Plan Discussed with: CRNA  Anesthesia Plan Comments:         Anesthesia Quick Evaluation

## 2015-07-23 NOTE — Anesthesia Postprocedure Evaluation (Signed)
  Anesthesia Post-op Note  Patient: Pamela Roy  Procedure(s) Performed: Procedure(s): KNEE ARTHROSCOPY WITH MEDIAL MENISECTOMY (Right)  Patient Location: PACU  Anesthesia Type:General  Level of Consciousness: awake and patient cooperative  Airway and Oxygen Therapy: Patient Spontanous Breathing  Post-op Pain: none  Post-op Assessment: Post-op Vital signs reviewed, Patient's Cardiovascular Status Stable, Respiratory Function Stable, Patent Airway and No signs of Nausea or vomiting              Post-op Vital Signs: Reviewed and stable    Complications: No apparent anesthesia complications  Late entry. Sherrill Raring CRNA

## 2015-07-23 NOTE — Transfer of Care (Signed)
Immediate Anesthesia Transfer of Care Note  Patient: Pamela Roy  Procedure(s) Performed: Procedure(s) (LRB): KNEE ARTHROSCOPY WITH MEDIAL MENISECTOMY (Right)  Patient Location: PACU  Anesthesia Type: General  Level of Consciousness: awake  Airway & Oxygen Therapy: Patient Spontanous Breathing and non-rebreather face mask  Post-op Assessment: Report given to PACU RN, Post -op Vital signs reviewed and stable and Patient moving all extremities  Post vital signs: Reviewed and stable  Complications: No apparent anesthesia complications

## 2015-07-23 NOTE — Interval H&P Note (Signed)
History and Physical Interval Note:  07/23/2015 10:22 AM  Pamela Roy  has presented today for surgery, with the diagnosis of medial meniscus tear, right  The various methods of treatment have been discussed with the patient and family. After consideration of risks, benefits and other options for treatment, the patient has consented to  Procedure(s): KNEE ARTHROSCOPY WITH MEDIAL MENISECTOMY (Right) as a surgical intervention .  The patient's history has been reviewed, patient examined, no change in status, stable for surgery.  I have reviewed the patient's chart and labs.  Questions were answered to the patient's satisfaction.     Arther Abbott

## 2015-07-26 ENCOUNTER — Encounter: Payer: Self-pay | Admitting: Orthopedic Surgery

## 2015-07-26 ENCOUNTER — Ambulatory Visit (INDEPENDENT_AMBULATORY_CARE_PROVIDER_SITE_OTHER): Payer: Self-pay | Admitting: Orthopedic Surgery

## 2015-07-26 VITALS — BP 127/88 | Ht 67.0 in | Wt 227.0 lb

## 2015-07-26 DIAGNOSIS — Z9889 Other specified postprocedural states: Secondary | ICD-10-CM

## 2015-07-26 NOTE — Progress Notes (Signed)
Patient ID: Pamela Roy, female   DOB: 04-Aug-1966, 49 y.o.   MRN: [id number]  Follow up visit  Chief complaint status post right knee arthroscopy on September 30   PRE-OPERATIVE DIAGNOSIS:  medial meniscus tear, right  POST-OPERATIVE DIAGNOSIS:  * same  *  PROCEDURE:  Procedure(s): KNEE ARTHROSCOPY WITH MEDIAL MENISECTOMY (Right)   Compartment grade 3 chondral lesion medial femoral condyle cartilage fragments floating in the joint. Tears of the posterior horn the medial meniscus at the root     There were no vitals taken for this visit.  Encounter Diagnosis  Name Primary?  . S/P right knee arthroscopy Yes    The patient is doing well with no medicine required at this point. She can start physical therapy after the sutures are removed today. Come back in 4 weeks. Start therapy 3 times a week for 3 weeks.  No signs of infection and the surgical wounds.

## 2015-07-26 NOTE — Patient Instructions (Signed)
Call [facility] therapy dept to schedule therapy visits 

## 2015-07-27 ENCOUNTER — Ambulatory Visit (HOSPITAL_COMMUNITY): Payer: BLUE CROSS/BLUE SHIELD | Attending: Orthopedic Surgery | Admitting: Physical Therapy

## 2015-07-27 DIAGNOSIS — M25561 Pain in right knee: Secondary | ICD-10-CM | POA: Diagnosis present

## 2015-07-27 DIAGNOSIS — Z9889 Other specified postprocedural states: Secondary | ICD-10-CM | POA: Insufficient documentation

## 2015-07-27 DIAGNOSIS — M25661 Stiffness of right knee, not elsewhere classified: Secondary | ICD-10-CM

## 2015-07-27 DIAGNOSIS — R6889 Other general symptoms and signs: Secondary | ICD-10-CM

## 2015-07-27 DIAGNOSIS — R262 Difficulty in walking, not elsewhere classified: Secondary | ICD-10-CM | POA: Diagnosis present

## 2015-07-27 NOTE — Patient Instructions (Signed)
   ANKLE PUMPS - AP  Bend your foot up and down at your ankle joint as shown. Repeat 20 times, 2-3 times per day.    HIP ABDUCTION - SIDELYING  While lying on your side, slowly raise up your top leg to the side. Keep your knee straight and maintain your toes pointed forward the entire time.   The bottom leg can be bent to stabilize your body.  Repeat 10 times, 2x/day.    LONG ARC QUAD - LAQ - HIGH SEAT  While seated with your knee in a bent position, slowly straighten your knee as you raise your foot upwards as shown. Hold for a count of 3 when you get your leg out completely straight.   Repeat 10 times, 2x/day.    WEIGHT SHIFT - LATERAL  While in a standing position and knees partially bent, slowly shift your body weight side-to-side.   Repeat 20 times, 2x/day.

## 2015-07-27 NOTE — Therapy (Signed)
[place], [place], [address] Phone: [phone]   Fax:  [phone]  Physical Therapy Evaluation  Patient Details  Name: Pamela Roy MRN: [id number] Date of Birth: 02-Aug-1966 Referring Provider:  Carole Civil, MD  Encounter Date: 07/27/2015      PT End of Session - 07/27/15 1620    Visit Number 1   Number of Visits 12   Date for PT Re-Evaluation 08/24/15   Authorization Type BCBS PPO    Authorization Time Period 07/27/15 to 09/26/15   PT Start Time 1540   PT Stop Time 1618   PT Time Calculation (min) 38 min   Activity Tolerance Patient tolerated treatment well   Behavior During Therapy [facility] for tasks assessed/performed      Past Medical History  Diagnosis Date  . Hypercholesteremia   . Depression   . PONV (postoperative nausea and vomiting)   . Helicobacter pylori gastritis   . Menstrual headache 09/30/2013  . Contraceptive management 09/30/2013  . Elevated cholesterol 06/08/2015  . Anxiety   . Arthritis   . GERD (gastroesophageal reflux disease)     Past Surgical History  Procedure Laterality Date  . Cholecystectomy    . Carpal tunnel release Bilateral   . Colonoscopy  10/14/2012    CBS:WHQPR internal hemorrhoids/Mild diverticulosis/Two sessile polyps ranging between 3-1mm in size. HYPERPLASTIC POLYPS. SCREENING in 2023.   Marland Kitchen Esophagogastroduodenoscopy  10/14/2012    FFM:BWGYKZLD inflammation was found in the duodenal bulb/Non-erosive gastritis (inflammation) +H.PYLORI  . Knee arthroscopy with medial menisectomy Right 07/23/2015    Procedure: KNEE ARTHROSCOPY WITH MEDIAL MENISECTOMY;  Surgeon: Carole Civil, MD;  Location: [facility];  Service: Orthopedics;  Laterality: Right;    There were no vitals filed for this visit.  Visit Diagnosis:  S/P right knee arthroscopy - Plan: PT plan of care cert/re-cert  Right knee pain - Plan: PT plan of care cert/re-cert  Knee stiffness, right -  Plan: PT plan of care cert/re-cert  Difficulty walking - Plan: PT plan of care cert/re-cert  Decreased functional activity tolerance - Plan: PT plan of care cert/re-cert      Subjective Assessment - 07/27/15 1544    Subjective Knee is still feeling a little swollen and it is still a little sore; otherwise unremarkable    Pertinent History R knee arthroscopic surgery on the 30th; patient was using walker to get around but has gotten rid of it since, has been able to walk without it.    How long can you stand comfortably? no limits but she also has not pushed herself    How long can you walk comfortably? hasn't really pushed how far she can walk just yet    Patient Stated Goals get back to PLOF    Currently in Pain? No/denies            [facility] PT Assessment - 07/27/15 0001    Assessment   Medical Diagnosis R knee arthroscopy    Onset Date/Surgical Date 07/23/15   Next MD Visit November 1st with Dr. Aline Brochure    Precautions   Precautions None   Restrictions   Weight Bearing Restrictions No   Balance Screen   Has the patient fallen in the past 6 months No   Has the patient had a decrease in activity level because of a fear of falling?  Yes   Is the patient reluctant to leave their home because of a fear of falling?  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; lots of walking on concrete floors   Observation/Other Assessments   Focus on Therapeutic Outcomes (FOTO)  59% limited    Posture/Postural Control   Posture Comments flexed at hips, reduced weight bearing R LE, forward head with B IR shoulders, increased lumbar lordosis    AROM   Right Knee Extension 5   Right Knee Flexion 115   Strength   Right Hip Flexion 4+/5   Right Hip Extension 4-/5   Right Hip ABduction 4/5   Left Hip Flexion 4+/5   Left Hip Extension 3+/5    Left Hip ABduction 4/5   Right Knee Flexion 4-/5   Right Knee Extension 4-/5   Left Knee Flexion 4+/5   Left Knee Extension 5/5   Right Ankle Dorsiflexion 5/5   Left Ankle Dorsiflexion 5/5   Ambulation/Gait   Gait Comments reduced gait speed, reduced rotation of trunk and pelvis, reduced step length L/stance time R, reduced TKE R, pronated bilaterally                    [facility] Adult PT Treatment/Exercise - 07/27/15 0001    Knee/Hip Exercises: Stretches   Active Hamstring Stretch Both;2 reps;30 seconds   Active Hamstring Stretch Limitations 12 inch box    Quad Stretch Right;3 reps;30 seconds   Quad Stretch Limitations prone with rope    Gastroc Stretch Both;3 reps;30 seconds   Gastroc Stretch Limitations slantboard    Knee/Hip Exercises: Standing   Rocker Board Limitations x20 lateral and AP, B HHA    Gait Training x281ft with cues for heel-toe gait pattern                 PT Education - 07/27/15 1620    Education provided Yes   Education Details prognosis, HEP, plan of care moving forward    Person(s) Educated Patient   Methods Explanation;Handout   Comprehension Verbalized understanding          PT Short Term Goals - 07/27/15 1631    PT SHORT TERM GOAL #1   Title Patient will improve R knee ROM from 0 degrees extension to 120 degrees flexion   Time 2   Period Weeks   Status New   PT SHORT TERM GOAL #2   Title Patient will demonstrate improved gait mechanics with improved heel-toe pattern and equal step lengths/equal weight bearing both LEs for unlimited distances, pain no more than 2/10   Time 2   Period Weeks   Status New   PT SHORT TERM GOAL #3   Title Patient will be able to correctly and consistently perform appropriate HEP, to be updated PRN    Time 2   Period Weeks   Status New           PT Long Term Goals - 07/27/15 1635    PT LONG TERM GOAL #1   Title Patient will demonstrate 5/5 strength in all LE and proximal muscles tested     Time 4   Period Weeks   Status New   PT LONG TERM GOAL #2   Title Patient will be able to ambulate unlimtied distances over even and uneven surfaces with proper gait mechanics and pain 0/10   Time 4   Period Weeks   Status New   PT LONG TERM GOAL #3   Title Patient will be  able to ascend/descend full flight of stairs with no railings and reciprocal pattern, no circumduction and good eccentric control    Time 4   Period Weeks   Status New   PT LONG TERM GOAL #4   Title Patient to report that she has been able to return to gym for a period of at least 20 minutes, at least 3 times per week, with pain no more than 1/10 during or after exercise period    Time 4   Period Weeks   Status New               Plan - 07/27/15 1622    Clinical Impression Statement Patient presents status post R knee arthroscopy with R knee stiffness, some R knee pain with activity, muscle weakness, gait and postural impairments, reduced functional activity tolerance, and reduced functional task performance at this time. Patient reports that she'd really like to get back to the gym, as it is important to her to resume her weight loss program; she also reports that she has been working to reduce her blood pressure as well. At this time the patient will benefit from skilled PT services in order to assist her in reaching an optimal level of function and meeting her functional goals.     Pt will benefit from skilled therapeutic intervention in order to improve on the following deficits Abnormal gait;Hypomobility;Increased edema;Decreased activity tolerance;Decreased strength;Pain;Decreased balance;Difficulty walking;Decreased coordination;Impaired flexibility;Postural dysfunction   Rehab Potential Excellent   PT Frequency 3x / week   PT Duration 4 weeks   PT Treatment/Interventions ADLs/Self Care Home Management;Cryotherapy;Gait training;Stair training;Functional mobility training;Therapeutic activities;Therapeutic  exercise;Balance training;Neuromuscular re-education;Patient/family education;Manual techniques   PT Next Visit Plan review HEP and goals; functional strengthening and stretching, manual PRN    PT Home Exercise Plan given    Consulted and Agree with Plan of Care Patient         Problem List Patient Active Problem List   Diagnosis Date Noted  . Medial meniscus tear   . Depression 06/08/2015  . Elevated cholesterol 06/08/2015  . Stiffness of joint, shoulder region 03/26/2014  . Right shoulder pain 03/26/2014  . Menstrual headache 09/30/2013  . Contraceptive management 09/30/2013  . Contusion, elbow 02/18/2013  . Helicobacter pylori gastritis 01/09/2013  . Heme positive stool 10/07/2012  . GERD (gastroesophageal reflux disease) 10/07/2012    Deniece Ree PT, DPT [facility] [address] [place], [place], [address] Phone: [phone]   Fax:  [phone]

## 2015-07-28 ENCOUNTER — Ambulatory Visit (HOSPITAL_COMMUNITY): Payer: BLUE CROSS/BLUE SHIELD

## 2015-07-28 DIAGNOSIS — R262 Difficulty in walking, not elsewhere classified: Secondary | ICD-10-CM

## 2015-07-28 DIAGNOSIS — Z9889 Other specified postprocedural states: Secondary | ICD-10-CM | POA: Diagnosis not present

## 2015-07-28 DIAGNOSIS — R6889 Other general symptoms and signs: Secondary | ICD-10-CM

## 2015-07-28 DIAGNOSIS — M25661 Stiffness of right knee, not elsewhere classified: Secondary | ICD-10-CM

## 2015-07-28 DIAGNOSIS — M25561 Pain in right knee: Secondary | ICD-10-CM

## 2015-07-28 NOTE — Therapy (Signed)
[place], [place], [address] Phone: [phone]   Fax:  [phone]  Physical Therapy Treatment  Patient Details  Name: Pamela Roy MRN: [id number] Date of Birth: [birth year] Referring Provider:  Carole Civil, MD  Encounter Date: 07/28/2015      PT End of Session - 07/28/15 0810    Visit Number 2   Number of Visits 12   Date for PT Re-Evaluation 08/24/15   Authorization Type BCBS PPO    Authorization Time Period 07/27/15 to 09/26/15   PT Start Time 0801   PT Stop Time 0848   PT Time Calculation (min) 47 min   Activity Tolerance Patient tolerated treatment well   Behavior During Therapy [facility] for tasks assessed/performed      Past Medical History  Diagnosis Date  . Hypercholesteremia   . Depression   . PONV (postoperative nausea and vomiting)   . Helicobacter pylori gastritis   . Menstrual headache 09/30/2013  . Contraceptive management 09/30/2013  . Elevated cholesterol 06/08/2015  . Anxiety   . Arthritis   . GERD (gastroesophageal reflux disease)     Past Surgical History  Procedure Laterality Date  . Cholecystectomy    . Carpal tunnel release Bilateral   . Colonoscopy  10/14/2012    GBT:DVVOH internal hemorrhoids/Mild diverticulosis/Two sessile polyps ranging between 3-53mm in size. HYPERPLASTIC POLYPS. SCREENING in 2023.   Marland Kitchen Esophagogastroduodenoscopy  10/14/2012    YWV:PXTGGYIR inflammation was found in the duodenal bulb/Non-erosive gastritis (inflammation) +H.PYLORI  . Knee arthroscopy with medial menisectomy Right 07/23/2015    Procedure: KNEE ARTHROSCOPY WITH MEDIAL MENISECTOMY;  Surgeon: Carole Civil, MD;  Location: [facility];  Service: Orthopedics;  Laterality: Right;    There were no vitals filed for this visit.  Visit Diagnosis:  S/P right knee arthroscopy  Right knee pain  Knee stiffness, right  Difficulty walking  Decreased functional activity tolerance       Subjective Assessment - 07/28/15 0808    Subjective Pt stated she was sore Rt calf following stretches yesterday.   Currently in Pain? Yes   Pain Score 2    Pain Location Calf   Pain Orientation Right   Pain Descriptors / Indicators Sore           [facility] Adult PT Treatment/Exercise - 07/28/15 0001    Exercises   Exercises Knee/Hip   Knee/Hip Exercises: Stretches   Active Hamstring Stretch 3 reps;30 seconds   Active Hamstring Stretch Limitations supine with rope   Quad Stretch Right;30 seconds;1 rep   Sports administrator Limitations prone with rope; c/o increased pain with stretch   Knee: Self-Stretch to increase Flexion Right   Knee: Self-Stretch Limitations 10x 10" for flexion on 14in step   Gastroc Stretch Both;3 reps;30 seconds   Gastroc Stretch Limitations slantboard    Knee/Hip Exercises: Standing   Rocker Board 2 minutes   Rocker Board Limitations R/L and A/P   Gait Training 230ft for heel to toe pattern and equal stance phase   Knee/Hip Exercises: Supine   Quad Sets Right;10 reps   Quad Sets Limitations tactile cueing and towel under knee   Short Arc [employer] Right;15 reps   Bridges 10 reps   Straight Leg Raises Both;10 reps   Knee/Hip Exercises: Sidelying   Hip ABduction Both;10 reps   Knee/Hip Exercises: Prone   Hamstring Curl 10 reps   Hip Extension Both;10 reps   Hip Extension Limitations cueing for form  PT Education - 07/27/15 1620    Education provided Yes   Education Details prognosis, HEP, plan of care moving forward    Person(s) Educated Patient   Methods Explanation;Handout   Comprehension Verbalized understanding          PT Short Term Goals - 07/27/15 1631    PT SHORT TERM GOAL #1   Title Patient will improve R knee ROM from 0 degrees extension to 120 degrees flexion   Time 2   Period Weeks   Status New   PT SHORT TERM GOAL #2   Title Patient will demonstrate improved gait mechanics with improved heel-toe pattern and equal step  lengths/equal weight bearing both LEs for unlimited distances, pain no more than 2/10   Time 2   Period Weeks   Status New   PT SHORT TERM GOAL #3   Title Patient will be able to correctly and consistently perform appropriate HEP, to be updated PRN    Time 2   Period Weeks   Status New           PT Long Term Goals - 07/27/15 1635    PT LONG TERM GOAL #1   Title Patient will demonstrate 5/5 strength in all LE and proximal muscles tested    Time 4   Period Weeks   Status New   PT LONG TERM GOAL #2   Title Patient will be able to ambulate unlimtied distances over even and uneven surfaces with proper gait mechanics and pain 0/10   Time 4   Period Weeks   Status New   PT LONG TERM GOAL #3   Title Patient will be able to ascend/descend full flight of stairs with no railings and reciprocal pattern, no circumduction and good eccentric control    Time 4   Period Weeks   Status New   PT LONG TERM GOAL #4   Title Patient to report that she has been able to return to gym for a period of at least 20 minutes, at least 3 times per week, with pain no more than 1/10 during or after exercise period    Time 4   Period Weeks   Status New               Plan - 07/28/15 3536    Clinical Impression Statement Reviewed goals, compliance with HEP and copy of evaluation given to pt.  Session focus on improving ROM, gait mechanics and LE strengthening.  Pt able to demonstrate appropriate techniques with all exercises with minimal verbal and tactile cueing required.  Gait training for heel to toe pattern and cueing to equalize stance phase to improve gait mechanics.  End of session pt stated pain free and improved gait mechanics noted.  Pt encouraged to apply ice at home for pain and edema control   PT Next Visit Plan Continue with current PT POC for functional strengthening and stretching, manual PRN   Begin standing exercises next session.        Problem List Patient Active Problem List    Diagnosis Date Noted  . Medial meniscus tear   . Depression 06/08/2015  . Elevated cholesterol 06/08/2015  . Stiffness of joint, shoulder region 03/26/2014  . Right shoulder pain 03/26/2014  . Menstrual headache 09/30/2013  . Contraceptive management 09/30/2013  . Contusion, elbow 02/18/2013  . Helicobacter pylori gastritis 01/09/2013  . Heme positive stool 10/07/2012  . GERD (gastroesophageal reflux disease) 10/07/2012   Ihor Austin, [place]; CBIS [phone]  Aldona Lento 07/28/2015, 8:56 AM  [place], [place], [address] Phone: [phone]   Fax:  [phone]

## 2015-07-30 ENCOUNTER — Ambulatory Visit (HOSPITAL_COMMUNITY): Payer: BLUE CROSS/BLUE SHIELD

## 2015-07-30 DIAGNOSIS — Z9889 Other specified postprocedural states: Secondary | ICD-10-CM

## 2015-07-30 DIAGNOSIS — R262 Difficulty in walking, not elsewhere classified: Secondary | ICD-10-CM

## 2015-07-30 DIAGNOSIS — M25561 Pain in right knee: Secondary | ICD-10-CM

## 2015-07-30 DIAGNOSIS — M25661 Stiffness of right knee, not elsewhere classified: Secondary | ICD-10-CM

## 2015-07-30 DIAGNOSIS — R6889 Other general symptoms and signs: Secondary | ICD-10-CM

## 2015-07-30 NOTE — Therapy (Signed)
[place], [place], [address] Phone: [phone]   Fax:  [phone]  Physical Therapy Treatment  Patient Details  Name: Pamela Roy MRN: [id number] Date of Birth: Sep 20, 1966 Referring Provider:  Carole Civil, MD  Encounter Date: 07/30/2015      PT End of Session - 07/30/15 0807    Visit Number 3   Number of Visits 12   Date for PT Re-Evaluation 08/24/15   Authorization Type BCBS PPO    Authorization Time Period 07/27/15 to 09/26/15   PT Start Time 0801   PT Stop Time 0844   PT Time Calculation (min) 43 min   Activity Tolerance Patient tolerated treatment well   Behavior During Therapy [facility] for tasks assessed/performed      Past Medical History  Diagnosis Date  . Hypercholesteremia   . Depression   . PONV (postoperative nausea and vomiting)   . Helicobacter pylori gastritis   . Menstrual headache 09/30/2013  . Contraceptive management 09/30/2013  . Elevated cholesterol 06/08/2015  . Anxiety   . Arthritis   . GERD (gastroesophageal reflux disease)     Past Surgical History  Procedure Laterality Date  . Cholecystectomy    . Carpal tunnel release Bilateral   . Colonoscopy  10/14/2012    DPO:EUMPN internal hemorrhoids/Mild diverticulosis/Two sessile polyps ranging between 3-5mm in size. HYPERPLASTIC POLYPS. SCREENING in 2023.   Marland Kitchen Esophagogastroduodenoscopy  10/14/2012    TIR:WERXVQMG inflammation was found in the duodenal bulb/Non-erosive gastritis (inflammation) +H.PYLORI  . Knee arthroscopy with medial menisectomy Right 07/23/2015    Procedure: KNEE ARTHROSCOPY WITH MEDIAL MENISECTOMY;  Surgeon: Carole Civil, MD;  Location: [facility];  Service: Orthopedics;  Laterality: Right;    There were no vitals filed for this visit.  Visit Diagnosis:  S/P right knee arthroscopy  Right knee pain  Knee stiffness, right  Difficulty walking  Decreased functional activity tolerance       Subjective Assessment - 07/30/15 0804    Subjective Pt stated no pain today, just a little sore Bil calfs   Currently in Pain? No/denies              [facility] Adult PT Treatment/Exercise - 07/30/15 0001    Exercises   Exercises Knee/Hip   Knee/Hip Exercises: Stretches   Active Hamstring Stretch 3 reps;30 seconds   Active Hamstring Stretch Limitations 14in step   Quad Stretch Right;30 seconds;3 reps   Sports administrator Limitations prone with rope   Gastroc Stretch Both;3 reps;30 seconds   Gastroc Stretch Limitations slantboard    Knee/Hip Exercises: Standing   Heel Raises 20 reps   Heel Raises Limitations Toe raises   Terminal Knee Extension Right;10 reps;Theraband   Theraband Level (Terminal Knee Extension) Level 2 (Red)   Hip Abduction 15 reps   Hip Extension 15 reps   Lateral Step Up Right;10 reps;Hand Hold: 1;Step Height: 4"   Forward Step Up Right;10 reps;Hand Hold: 1;Step Height: 4"   Functional Squat 10 reps   Rocker Board 2 minutes   Rocker Board Limitations R/L and A/P   SLS Rt 18", Lt 38" max of 3   Gait Training 286ft for heel to toe pattern and equal stance phase   Knee/Hip Exercises: Supine   Short Arc Quad Sets Right;15 reps   Terminal Knee Extension 10 reps   Straight Leg Raises 15 reps   Knee/Hip Exercises: Prone   Hip Extension Both;15 reps  PT Short Term Goals - 07/27/15 1631    PT SHORT TERM GOAL #1   Title Patient will improve R knee ROM from 0 degrees extension to 120 degrees flexion   Time 2   Period Weeks   Status New   PT SHORT TERM GOAL #2   Title Patient will demonstrate improved gait mechanics with improved heel-toe pattern and equal step lengths/equal weight bearing both LEs for unlimited distances, pain no more than 2/10   Time 2   Period Weeks   Status New   PT SHORT TERM GOAL #3   Title Patient will be able to correctly and consistently perform appropriate HEP, to be updated PRN    Time 2   Period Weeks   Status New            PT Long Term Goals - 07/27/15 1635    PT LONG TERM GOAL #1   Title Patient will demonstrate 5/5 strength in all LE and proximal muscles tested    Time 4   Period Weeks   Status New   PT LONG TERM GOAL #2   Title Patient will be able to ambulate unlimtied distances over even and uneven surfaces with proper gait mechanics and pain 0/10   Time 4   Period Weeks   Status New   PT LONG TERM GOAL #3   Title Patient will be able to ascend/descend full flight of stairs with no railings and reciprocal pattern, no circumduction and good eccentric control    Time 4   Period Weeks   Status New   PT LONG TERM GOAL #4   Title Patient to report that she has been able to return to gym for a period of at least 20 minutes, at least 3 times per week, with pain no more than 1/10 during or after exercise period    Time 4   Period Weeks   Status New               Plan - 07/30/15 3474    Clinical Impression Statement Pt improving gait mechanics with min cueing for equal stance phase, able to demonstrate appropriate heel to toe pattern.  Progressed strengthening and stretches with standing exercises this session.  Pt able to demonstrate appropriate technqiue with all exercises with min verbal cueing for form with squats and stair strengthening.  No reoprts of pain through session, pt was encouraged to apply ice at home for pain and edema control following increased weight bearing this session.     PT Next Visit Plan Continue with current PT POC for functional strengthening and stretching, manual PRN          Problem List Patient Active Problem List   Diagnosis Date Noted  . Medial meniscus tear   . Depression 06/08/2015  . Elevated cholesterol 06/08/2015  . Stiffness of joint, shoulder region 03/26/2014  . Right shoulder pain 03/26/2014  . Menstrual headache 09/30/2013  . Contraceptive management 09/30/2013  . Contusion, elbow 02/18/2013  . Helicobacter pylori gastritis  01/09/2013  . Heme positive stool 10/07/2012  . GERD (gastroesophageal reflux disease) 10/07/2012   Ihor Austin, [place]; [place]  Aldona Lento 07/30/2015, 9:04 AM  [place], [place], [address] Phone: [phone]   Fax:  [phone]

## 2015-08-02 ENCOUNTER — Ambulatory Visit (HOSPITAL_COMMUNITY): Payer: BLUE CROSS/BLUE SHIELD | Admitting: Physical Therapy

## 2015-08-02 DIAGNOSIS — Z9889 Other specified postprocedural states: Secondary | ICD-10-CM

## 2015-08-02 DIAGNOSIS — R6889 Other general symptoms and signs: Secondary | ICD-10-CM

## 2015-08-02 DIAGNOSIS — R262 Difficulty in walking, not elsewhere classified: Secondary | ICD-10-CM

## 2015-08-02 DIAGNOSIS — M25561 Pain in right knee: Secondary | ICD-10-CM

## 2015-08-02 DIAGNOSIS — M25661 Stiffness of right knee, not elsewhere classified: Secondary | ICD-10-CM

## 2015-08-02 NOTE — Therapy (Signed)
[place], [place], [address] Phone: [phone]   Fax:  [phone]  Physical Therapy Treatment  Patient Details  Name: Pamela Roy MRN: [id number] Date of Birth: 30-Sep-1966 Referring Provider:  Carole Civil, MD  Encounter Date: 08/02/2015      PT End of Session - 08/02/15 0832    Visit Number 4   Number of Visits 12   Date for PT Re-Evaluation 08/24/15   PT Start Time 0801   PT Stop Time 2536   PT Time Calculation (min) 43 min      Past Medical History  Diagnosis Date  . Hypercholesteremia   . Depression   . PONV (postoperative nausea and vomiting)   . Helicobacter pylori gastritis   . Menstrual headache 09/30/2013  . Contraceptive management 09/30/2013  . Elevated cholesterol 06/08/2015  . Anxiety   . Arthritis   . GERD (gastroesophageal reflux disease)     Past Surgical History  Procedure Laterality Date  . Cholecystectomy    . Carpal tunnel release Bilateral   . Colonoscopy  10/14/2012    UYQ:IHKVQ internal hemorrhoids/Mild diverticulosis/Two sessile polyps ranging between 3-84m in size. HYPERPLASTIC POLYPS. SCREENING in 2023.   .Marland KitchenEsophagogastroduodenoscopy  10/14/2012    SQVZ:DGLOVFIEinflammation was found in the duodenal bulb/Non-erosive gastritis (inflammation) +H.PYLORI  . Knee arthroscopy with medial menisectomy Right 07/23/2015    Procedure: KNEE ARTHROSCOPY WITH MEDIAL MENISECTOMY;  Surgeon: SCarole Civil MD;  Location: [facility];  Service: Orthopedics;  Laterality: Right;    There were no vitals filed for this visit.  Visit Diagnosis:  S/P right knee arthroscopy  Right knee pain  Knee stiffness, right  Difficulty walking  Decreased functional activity tolerance      Subjective Assessment - 08/02/15 0803    Subjective Pt states that she is back doing everything that she wants to be doing   Currently in Pain? No/denies               [facility] Adult PT  Treatment/Exercise - 08/02/15 0001    Knee/Hip Exercises: Stretches   Knee: Self-Stretch to increase Flexion Right;3 reps;30 seconds   Gastroc Stretch Both;3 reps;30 seconds   Gastroc Stretch Limitations slantboard    Knee/Hip Exercises: Standing   Heel Raises Both;10 reps   Heel Raises Limitations combined with squats    Knee Flexion Strengthening;Right   Knee Flexion Limitations 4"   Terminal Knee Extension Right;10 reps   Theraband Level (Terminal Knee Extension) --  manual    Lateral Step Up Right;10 reps;Hand Hold: 0;Step Height: 6"   Forward Step Up Right;10 reps;Step Height: 6"   Functional Squat 10 reps   Stairs 3 x RT 7"    Rocker Board 2 minutes   SLS Rt x 5 max 24"     Gait Training tandem gt x 2 RT    Knee/Hip Exercises: Supine   Quad Sets 10 reps   Knee Extension Limitations 0   Knee Flexion Limitations 121   Knee/Hip Exercises: Prone   Hamstring Curl 10 reps   Hamstring Curl Limitations 4#   Hip Extension 10 reps   Hip Extension Limitations 4#                  PT Short Term Goals - 08/02/15 03329   PT SHORT TERM GOAL #2   Title Patient will demonstrate improved gait mechanics with improved heel-toe pattern and equal step lengths/equal weight bearing both LEs for  unlimited distances, pain no more than 2/10   Time 2   Period Weeks   Status Achieved   PT SHORT TERM GOAL #3   Title Patient will be able to correctly and consistently perform appropriate HEP, to be updated PRN    Time 2   Period Weeks   Status Achieved           PT Long Term Goals - 08/02/15 0820    PT LONG TERM GOAL #1   Title Patient will demonstrate 5/5 strength in all LE and proximal muscles tested    Time 4   Period Weeks   Status On-going   PT LONG TERM GOAL #2   Title Patient will be able to ambulate unlimtied distances over even and uneven surfaces with proper gait mechanics and pain 0/10   Time 4   Period Weeks   Status On-going   PT LONG TERM GOAL #3   Title  Patient will be able to ascend/descend full flight of stairs with no railings and reciprocal pattern, no circumduction and good eccentric control    Time 4   Period Weeks   Status Partially Met               Plan - 08/02/15 0835    Clinical Impression Statement PT having no pain or limitation at home but has not began to walk any appreciable distance.  Pt is going to try and increase her walking to assess how her knee feels.  No pain or edema today.    PT Next Visit Plan Continue with current PT POC for functional strengthening and stretching, manual PRN.  If pt stays pain free reassess at the end of the week for possible discharge.         Problem List Patient Active Problem List   Diagnosis Date Noted  . Medial meniscus tear   . Depression 06/08/2015  . Elevated cholesterol 06/08/2015  . Stiffness of joint, shoulder region 03/26/2014  . Right shoulder pain 03/26/2014  . Menstrual headache 09/30/2013  . Contraceptive management 09/30/2013  . Contusion, elbow 02/18/2013  . Helicobacter pylori gastritis 01/09/2013  . Heme positive stool 10/07/2012  . GERD (gastroesophageal reflux disease) 10/07/2012  Rayetta Humphrey, PT CLT [phone] 08/02/2015, 8:43 AM  [place], [place], [address] Phone: [phone]   Fax:  [phone]

## 2015-08-04 ENCOUNTER — Ambulatory Visit (HOSPITAL_COMMUNITY): Payer: BLUE CROSS/BLUE SHIELD | Admitting: Physical Therapy

## 2015-08-04 DIAGNOSIS — R262 Difficulty in walking, not elsewhere classified: Secondary | ICD-10-CM

## 2015-08-04 DIAGNOSIS — M25561 Pain in right knee: Secondary | ICD-10-CM

## 2015-08-04 DIAGNOSIS — M25661 Stiffness of right knee, not elsewhere classified: Secondary | ICD-10-CM

## 2015-08-04 DIAGNOSIS — Z9889 Other specified postprocedural states: Secondary | ICD-10-CM

## 2015-08-04 DIAGNOSIS — R6889 Other general symptoms and signs: Secondary | ICD-10-CM

## 2015-08-04 NOTE — Therapy (Signed)
[place], [place], [address] Phone: [phone]   Fax:  [phone]  Physical Therapy Treatment  Patient Details  Name: Pamela Roy MRN: [id number] Date of Birth: May 26, 1966 Referring Provider:  Carole Civil, MD  Encounter Date: 08/04/2015      PT End of Session - 08/04/15 0956    Visit Number 5   Number of Visits 12   Date for PT Re-Evaluation 08/24/15   PT Start Time 0800   PT Stop Time 0848   PT Time Calculation (min) 48 min   Activity Tolerance Patient tolerated treatment well   Behavior During Therapy [facility] for tasks assessed/performed      Past Medical History  Diagnosis Date  . Hypercholesteremia   . Depression   . PONV (postoperative nausea and vomiting)   . Helicobacter pylori gastritis   . Menstrual headache 09/30/2013  . Contraceptive management 09/30/2013  . Elevated cholesterol 06/08/2015  . Anxiety   . Arthritis   . GERD (gastroesophageal reflux disease)     Past Surgical History  Procedure Laterality Date  . Cholecystectomy    . Carpal tunnel release Bilateral   . Colonoscopy  10/14/2012    DCV:UDTHY internal hemorrhoids/Mild diverticulosis/Two sessile polyps ranging between 3-61m in size. HYPERPLASTIC POLYPS. SCREENING in 2023.   .Marland KitchenEsophagogastroduodenoscopy  10/14/2012    SHOO:ILNZVJKQinflammation was found in the duodenal bulb/Non-erosive gastritis (inflammation) +H.PYLORI  . Knee arthroscopy with medial menisectomy Right 07/23/2015    Procedure: KNEE ARTHROSCOPY WITH MEDIAL MENISECTOMY;  Surgeon: SCarole Civil MD;  Location: [facility];  Service: Orthopedics;  Laterality: Right;    There were no vitals filed for this visit.  Visit Diagnosis:  S/P right knee arthroscopy  Right knee pain  Knee stiffness, right  Difficulty walking  Decreased functional activity tolerance      Subjective Assessment - 08/04/15 0955    Subjective Pt states she is doing well  overall without pain.  currently having no difficulties with functional tasks.    Currently in Pain? No/denies                         [facility] 0810    Knee/Hip Exercises: Stretches   Active Hamstring Stretch 3 reps;30 seconds   Active Hamstring Stretch Limitations 14in step   Knee: Self-Stretch to increase Flexion Right   Knee: Self-Stretch Limitations 10x 10" for flexion on 14in step   Gastroc Stretch Both;3 reps;30 seconds   Gastroc Stretch Limitations slantboard    Knee/Hip Exercises: Aerobic   Nustep 6 minutes at end of session LE only level 2 hills 2   Knee/Hip Exercises: Standing   Heel Raises Both;15 reps   Heel Raises Limitations combined with squats    Knee Flexion Strengthening;Right;15 reps   Knee Flexion Limitations 6" riser TKF   Hip Abduction 15 reps   Hip Extension 15 reps   Lateral Step Up Right;Hand Hold: 0;Step Height: 6";15 reps   Lateral Step Up Limitations 6"   Forward Step Up Right;Step Height: 6";15 reps   Forward Step Up Limitations 6"   Step Down Right;Hand Hold: 1;10 reps;Step Height: 4"   Step Down Limitations 4"   Stairs 3 x RT 7"    SLS Rt x 3 max 54"     Gait Training balance beam 1RT  PT Short Term Goals - 08/02/15 0819    PT SHORT TERM GOAL #2   Title Patient will demonstrate improved gait mechanics with improved heel-toe pattern and equal step lengths/equal weight bearing both LEs for unlimited distances, pain no more than 2/10   Time 2   Period Weeks   Status Achieved   PT SHORT TERM GOAL #3   Title Patient will be able to correctly and consistently perform appropriate HEP, to be updated PRN    Time 2   Period Weeks   Status Achieved           PT Long Term Goals - 08/02/15 0820    PT LONG TERM GOAL #1   Title Patient will demonstrate 5/5 strength in all LE and proximal muscles tested    Time 4   Period Weeks   Status On-going   PT LONG TERM GOAL #2    Title Patient will be able to ambulate unlimtied distances over even and uneven surfaces with proper gait mechanics and pain 0/10   Time 4   Period Weeks   Status On-going   PT LONG TERM GOAL #3   Title Patient will be able to ascend/descend full flight of stairs with no railings and reciprocal pattern, no circumduction and good eccentric control    Time 4   Period Weeks   Status Partially Met               Plan - 08/04/15 0957    Clinical Impression Statement Pt continues to do well without diffiuculties or pain.  Progressed to balance beam, forward step downs and added nustep for activity tolerance and conditioning.  Encouraged patient to participate in some aerobic activity/walking    PT Next Visit Plan Continue with current PT POC for functional strengthening and stretching, manual PRN.  If pt stays pain free reassess at the end of the week for possible discharge.         Problem List Patient Active Problem List   Diagnosis Date Noted  . Medial meniscus tear   . Depression 06/08/2015  . Elevated cholesterol 06/08/2015  . Stiffness of joint, shoulder region 03/26/2014  . Right shoulder pain 03/26/2014  . Menstrual headache 09/30/2013  . Contraceptive management 09/30/2013  . Contusion, elbow 02/18/2013  . Helicobacter pylori gastritis 01/09/2013  . Heme positive stool 10/07/2012  . GERD (gastroesophageal reflux disease) 10/07/2012    Teena Irani, PTA/CLT [phone]  08/04/2015, 10:10 AM  [place], [place], [address] Phone: [phone]   Fax:  [phone]

## 2015-08-06 ENCOUNTER — Telehealth: Payer: Self-pay | Admitting: Orthopedic Surgery

## 2015-08-06 ENCOUNTER — Ambulatory Visit (HOSPITAL_COMMUNITY): Payer: BLUE CROSS/BLUE SHIELD | Admitting: Physical Therapy

## 2015-08-06 DIAGNOSIS — Z9889 Other specified postprocedural states: Secondary | ICD-10-CM

## 2015-08-06 DIAGNOSIS — M25561 Pain in right knee: Secondary | ICD-10-CM

## 2015-08-06 DIAGNOSIS — R262 Difficulty in walking, not elsewhere classified: Secondary | ICD-10-CM

## 2015-08-06 DIAGNOSIS — M25661 Stiffness of right knee, not elsewhere classified: Secondary | ICD-10-CM

## 2015-08-06 DIAGNOSIS — R6889 Other general symptoms and signs: Secondary | ICD-10-CM

## 2015-08-06 NOTE — Therapy (Signed)
[place], [place], [address] Phone: [phone]   Fax:  [phone]  Physical Therapy Treatment (Discharge)  Patient Details  Name: Pamela Roy MRN: [id number] Date of Birth: 25-Dec-1965 No Data Recorded  Encounter Date: 08/06/2015      PT End of Session - 08/06/15 0842    Visit Number 6   Number of Visits 6   Authorization Type [employer] PPO    Authorization Time Period 07/27/15 to 09/26/15   PT Start Time 0808   PT Stop Time 0838   PT Time Calculation (min) 30 min   Activity Tolerance Patient tolerated treatment well   Behavior During Therapy [facility] for tasks assessed/performed      Past Medical History  Diagnosis Date  . Hypercholesteremia   . Depression   . PONV (postoperative nausea and vomiting)   . Helicobacter pylori gastritis   . Menstrual headache 09/30/2013  . Contraceptive management 09/30/2013  . Elevated cholesterol 06/08/2015  . Anxiety   . Arthritis   . GERD (gastroesophageal reflux disease)     Past Surgical History  Procedure Laterality Date  . Cholecystectomy    . Carpal tunnel release Bilateral   . Colonoscopy  10/14/2012    KJZ:PHXTA internal hemorrhoids/Mild diverticulosis/Two sessile polyps ranging between 3-35mm in size. HYPERPLASTIC POLYPS. SCREENING in 2023.   Marland Kitchen Esophagogastroduodenoscopy  10/14/2012    VWP:VXYIAXKP inflammation was found in the duodenal bulb/Non-erosive gastritis (inflammation) +H.PYLORI  . Knee arthroscopy with medial menisectomy Right 07/23/2015    Procedure: KNEE ARTHROSCOPY WITH MEDIAL MENISECTOMY;  Surgeon: Carole Civil, MD;  Location: [facility];  Service: Orthopedics;  Laterality: Right;    There were no vitals filed for this visit.  Visit Diagnosis:  S/P right knee arthroscopy  Right knee pain  Knee stiffness, right  Difficulty walking  Decreased functional activity tolerance      Subjective Assessment - 08/06/15 0809    Subjective Patient  reports she is continuing to do very well and still not having any pain    Pertinent History R knee arthroscopic surgery on the 30th; patient was using walker to get around but has gotten rid of it since, has been able to walk without it.    How long can you stand comfortably? 10/14- no limits    How long can you walk comfortably? 10/14- no limits    Patient Stated Goals get back to PLOF    Currently in Pain? No/denies            [facility] PT Assessment - 08/06/15 0001    Observation/Other Assessments   Focus on Therapeutic Outcomes (FOTO)  10% limited    AROM   Right Knee Extension 0   Right Knee Flexion 128   Strength   Right Hip Flexion 4+/5   Right Hip Extension 4+/5   Right Hip ABduction 5/5   Left Hip Flexion 5/5   Left Hip Extension 4+/5   Left Hip ABduction 4/5   Right Knee Flexion 4+/5   Right Knee Extension 4+/5   Left Knee Flexion 4+/5   Left Knee Extension 5/5   Right Ankle Dorsiflexion 5/5   Left Ankle Dorsiflexion 5/5                     [facility] Adult PT Treatment/Exercise - 08/06/15 0001    Knee/Hip Exercises: Stretches   Active Hamstring Stretch 3 reps;30 seconds   Active Hamstring Stretch Limitations 14in step   Knee:  Self-Stretch to increase Flexion --   Knee: Self-Stretch Limitations --   Piriformis Stretch Both;2 reps;30 seconds   Piriformis Stretch Limitations seated   Gastroc Stretch Both;3 reps;30 seconds   Gastroc Stretch Limitations slantboard                 PT Education - 08/06/15 [phone]    Education provided Yes   Education Details progress with skilled PT services today, stretching HEP, advised to adjust gym activity to pain and edema as she returns to gym, advised to speak to MD about returning to work    [employer]) Educated Patient   Methods Explanation;Handout   Comprehension Verbalized understanding          PT Short Term Goals - 08/06/15 0829    PT SHORT TERM GOAL #1   Title Patient will improve R knee ROM from 0  degrees extension to 120 degrees flexion   Baseline 10/14- 0 to 128 today    Time 2   Period Weeks   Status Achieved   PT SHORT TERM GOAL #2   Title Patient will demonstrate improved gait mechanics with improved heel-toe pattern and equal step lengths/equal weight bearing both LEs for unlimited distances, pain no more than 2/10   Time 2   Period Weeks   Status Achieved   PT SHORT TERM GOAL #3   Title Patient will be able to correctly and consistently perform appropriate HEP, to be updated PRN    Time 2   Period Weeks   Status Achieved           PT Long Term Goals - 08/06/15 5366    PT LONG TERM GOAL #1   Title Patient will demonstrate 5/5 strength in all LE and proximal muscles tested    Time 4   Period Weeks   Status Partially Met   PT LONG TERM GOAL #2   Title Patient will be able to ambulate unlimtied distances over even and uneven surfaces with proper gait mechanics and pain 0/10   Time 4   Period Weeks   Status Achieved   PT LONG TERM GOAL #3   Title Patient will be able to ascend/descend full flight of stairs with no railings and reciprocal pattern, no circumduction and good eccentric control    Time 4   Period Weeks   Status Achieved   PT LONG TERM GOAL #4   Title Patient to report that she has been able to return to gym for a period of at least 20 minutes, at least 3 times per week, with pain no more than 1/10 during or after exercise period    Baseline 10/14- patient has not tried going to gym yet    Time 4   Period Weeks   Status Not Met               Plan - 08/06/15 0843    Clinical Impression Statement Patient arrived late today; re-assessment performed today as patient is doing very well overall. At this time she demonstrates full right knee range of motion and good improvements in strength; she reports that she has been walking regularly about 30 minutes and continues to have no pain during all functional tasks. Patient is very motivated to keep up  with activity and to return to exercise at gym with her son. Patient is appropriate for discharge today.     Pt will benefit from skilled therapeutic intervention in order to improve on the following deficits Abnormal gait;Hypomobility;Increased  edema;Decreased activity tolerance;Decreased strength;Pain;Decreased balance;Difficulty walking;Decreased coordination;Impaired flexibility;Postural dysfunction   Rehab Potential Excellent   PT Frequency 3x / week   PT Duration 4 weeks   PT Treatment/Interventions ADLs/Self Care Home Management;Cryotherapy;Gait training;Stair training;Functional mobility training;Therapeutic activities;Therapeutic exercise;Balance training;Neuromuscular re-education;Patient/family education;Manual techniques   PT Next Visit Plan DC today    PT Home Exercise Plan given    Consulted and Agree with Plan of Care Patient        Problem List Patient Active Problem List   Diagnosis Date Noted  . Medial meniscus tear   . Depression 06/08/2015  . Elevated cholesterol 06/08/2015  . Stiffness of joint, shoulder region 03/26/2014  . Right shoulder pain 03/26/2014  . Menstrual headache 09/30/2013  . Contraceptive management 09/30/2013  . Contusion, elbow 02/18/2013  . Helicobacter pylori gastritis 01/09/2013  . Heme positive stool 10/07/2012  . GERD (gastroesophageal reflux disease) 10/07/2012    PHYSICAL THERAPY DISCHARGE SUMMARY  Visits from Start of Care: 6  Current functional level related to goals / functional outcomes: Patient reports no pain with functional tasks and activities,  Reports that she has been walking at least 30 minutes regularly and is continuing to work on squatting as her balance is her major limitation with this. Eager to return to the gym with her son.    Remaining deficits: Some difficulty with squats, some muscle tightness    Education / Equipment: Stretching HEP, safety at gym, advised to speak to MD regarding return to work   Plan: Patient agrees to discharge.  Patient goals were met. Patient is being discharged due to meeting the stated rehab goals.  ?????       Deniece Ree PT, DPT [place] [address] [place], [place], [address] Phone: [phone]   Fax:  [phone]  Name: Pamela Roy MRN: [id number] Date of Birth: 01/01/1966

## 2015-08-06 NOTE — Patient Instructions (Signed)
   Hamstring stretch with foot on stool  While standing, place foot on stool or elevated surface.  Keep the foot of the leg that is being stretched in an upward facing direction and the hip in a neutral position.  Gently lean forward at the hip while keeping the spine in a neutral position.   Hold for 30 seconds, 3 times each leg; do this stretch routine 2x/day.    Achilles stretch on step  Stand on the lowest step of a staircase facing up.  Hold onto the rail or wall with one hand. Stand with balls of feet on the step and heels hanging over the end.  Keeping your knees straight, lower heels down over the edge of the step, hold for 30 seconds, then raise up fully onto your toes briefly.  Repeat.  You can also stand at your counter and put your toes up against the lower door to get a stretch in your calves.  Hold for 30 seconds, 3 times; do this stretch routine 2x/day.

## 2015-08-06 NOTE — Telephone Encounter (Signed)
Form faxed to [facility], [phone], for short-term disability (dates of treatment to date, 05/06/15 through 07/26/15); signed authorization on file.

## 2015-08-09 ENCOUNTER — Encounter (HOSPITAL_COMMUNITY): Payer: BLUE CROSS/BLUE SHIELD | Admitting: Physical Therapy

## 2015-08-11 ENCOUNTER — Encounter (HOSPITAL_COMMUNITY): Payer: BLUE CROSS/BLUE SHIELD | Admitting: Physical Therapy

## 2015-08-13 ENCOUNTER — Encounter (HOSPITAL_COMMUNITY): Payer: BLUE CROSS/BLUE SHIELD

## 2015-08-16 ENCOUNTER — Encounter (HOSPITAL_COMMUNITY): Payer: BLUE CROSS/BLUE SHIELD | Admitting: Physical Therapy

## 2015-08-18 ENCOUNTER — Encounter (HOSPITAL_COMMUNITY): Payer: BLUE CROSS/BLUE SHIELD | Admitting: Physical Therapy

## 2015-08-20 ENCOUNTER — Encounter (HOSPITAL_COMMUNITY): Payer: BLUE CROSS/BLUE SHIELD

## 2015-08-23 ENCOUNTER — Encounter (HOSPITAL_COMMUNITY): Payer: BLUE CROSS/BLUE SHIELD | Admitting: Physical Therapy

## 2015-08-23 ENCOUNTER — Other Ambulatory Visit: Payer: Self-pay | Admitting: Adult Health

## 2015-08-24 ENCOUNTER — Encounter: Payer: Self-pay | Admitting: Orthopedic Surgery

## 2015-08-24 ENCOUNTER — Ambulatory Visit (INDEPENDENT_AMBULATORY_CARE_PROVIDER_SITE_OTHER): Payer: BLUE CROSS/BLUE SHIELD | Admitting: Orthopedic Surgery

## 2015-08-24 VITALS — BP 127/90 | Ht 62.0 in | Wt 234.0 lb

## 2015-08-24 DIAGNOSIS — Z4789 Encounter for other orthopedic aftercare: Secondary | ICD-10-CM

## 2015-08-24 DIAGNOSIS — Z9889 Other specified postprocedural states: Secondary | ICD-10-CM

## 2015-08-24 NOTE — Progress Notes (Signed)
Patient ID: Pamela Roy, female   DOB: 1966/05/24, 49 y.o.   MRN: [id number]  Follow up visit  Chief Complaint  Patient presents with  . Follow-up    post op 2, SARK/MM, DOS 07/23/15    BP 127/90 mmHg  Ht 5\' 2"  (1.575 m)  Wt 234 lb (106.142 kg)  BMI 42.79 kg/m2  Encounter Diagnoses  Name Primary?  . S/P right knee arthroscopy   . Surgical aftercare, musculoskeletal system Yes    Pamela Roy is doing very well she's returned from physical therapy with full range of motion and no pain. Her knee looks good there is no swelling no tenderness. Full range of motion. McMurray's negative. Knee stable.  Return to work today November 1  Follow-up as needed.

## 2015-08-24 NOTE — Patient Instructions (Signed)
Return to work 11-02-14 

## 2015-08-25 ENCOUNTER — Encounter (HOSPITAL_COMMUNITY): Payer: BLUE CROSS/BLUE SHIELD | Admitting: Physical Therapy

## 2015-08-27 ENCOUNTER — Encounter (HOSPITAL_COMMUNITY): Payer: BLUE CROSS/BLUE SHIELD

## 2015-09-10 ENCOUNTER — Other Ambulatory Visit: Payer: Self-pay | Admitting: Adult Health

## 2015-12-09 ENCOUNTER — Ambulatory Visit: Payer: BLUE CROSS/BLUE SHIELD | Admitting: Gastroenterology

## 2016-01-27 ENCOUNTER — Telehealth: Payer: Self-pay | Admitting: Adult Health

## 2016-01-27 MED ORDER — DESOGESTREL-ETHINYL ESTRADIOL 0.15-0.02/0.01 MG (21/5) PO TABS: 1.0000 | ORAL_TABLET | Freq: Every day | ORAL | Status: DC

## 2016-01-27 NOTE — Telephone Encounter (Signed)
Will refill OCs

## 2016-01-27 NOTE — Telephone Encounter (Signed)
Pt called stating that she didn't know that her insurance have changed the way she receive medication. Pt states that she would like a prescription called into the [facility] pharmacy for the weekend for her birth control and if we could call another in to [employer]. Please contact pt

## 2016-03-07 DIAGNOSIS — H6121 Impacted cerumen, right ear: Secondary | ICD-10-CM | POA: Diagnosis not present

## 2016-03-23 DIAGNOSIS — Z79899 Other long term (current) drug therapy: Secondary | ICD-10-CM | POA: Diagnosis not present

## 2016-03-23 DIAGNOSIS — E785 Hyperlipidemia, unspecified: Secondary | ICD-10-CM | POA: Diagnosis not present

## 2016-03-23 DIAGNOSIS — Z139 Encounter for screening, unspecified: Secondary | ICD-10-CM | POA: Diagnosis not present

## 2016-03-23 DIAGNOSIS — Z008 Encounter for other general examination: Secondary | ICD-10-CM | POA: Diagnosis not present

## 2016-03-23 DIAGNOSIS — E559 Vitamin D deficiency, unspecified: Secondary | ICD-10-CM | POA: Diagnosis not present

## 2016-03-23 DIAGNOSIS — Z6841 Body Mass Index (BMI) 40.0 and over, adult: Secondary | ICD-10-CM | POA: Diagnosis not present

## 2016-04-13 DIAGNOSIS — Z6841 Body Mass Index (BMI) 40.0 and over, adult: Secondary | ICD-10-CM | POA: Diagnosis not present

## 2016-04-13 DIAGNOSIS — E559 Vitamin D deficiency, unspecified: Secondary | ICD-10-CM | POA: Diagnosis not present

## 2016-04-13 DIAGNOSIS — E785 Hyperlipidemia, unspecified: Secondary | ICD-10-CM | POA: Diagnosis not present

## 2016-05-09 DIAGNOSIS — F419 Anxiety disorder, unspecified: Secondary | ICD-10-CM | POA: Diagnosis not present

## 2016-05-09 DIAGNOSIS — Z0000 Encounter for general adult medical examination without abnormal findings: Secondary | ICD-10-CM | POA: Diagnosis not present

## 2016-05-09 DIAGNOSIS — M150 Primary generalized (osteo)arthritis: Secondary | ICD-10-CM | POA: Diagnosis not present

## 2016-05-09 DIAGNOSIS — E559 Vitamin D deficiency, unspecified: Secondary | ICD-10-CM | POA: Diagnosis not present

## 2016-05-11 DIAGNOSIS — Z008 Encounter for other general examination: Secondary | ICD-10-CM | POA: Diagnosis not present

## 2016-05-11 DIAGNOSIS — E559 Vitamin D deficiency, unspecified: Secondary | ICD-10-CM | POA: Diagnosis not present

## 2016-05-11 DIAGNOSIS — Z6841 Body Mass Index (BMI) 40.0 and over, adult: Secondary | ICD-10-CM | POA: Diagnosis not present

## 2016-05-11 DIAGNOSIS — E785 Hyperlipidemia, unspecified: Secondary | ICD-10-CM | POA: Diagnosis not present

## 2016-07-13 DIAGNOSIS — Z139 Encounter for screening, unspecified: Secondary | ICD-10-CM | POA: Diagnosis not present

## 2016-07-13 DIAGNOSIS — E559 Vitamin D deficiency, unspecified: Secondary | ICD-10-CM | POA: Diagnosis not present

## 2016-07-13 DIAGNOSIS — E785 Hyperlipidemia, unspecified: Secondary | ICD-10-CM | POA: Diagnosis not present

## 2016-07-13 DIAGNOSIS — Z79899 Other long term (current) drug therapy: Secondary | ICD-10-CM | POA: Diagnosis not present

## 2016-07-17 ENCOUNTER — Ambulatory Visit (INDEPENDENT_AMBULATORY_CARE_PROVIDER_SITE_OTHER): Payer: BLUE CROSS/BLUE SHIELD

## 2016-07-17 ENCOUNTER — Ambulatory Visit (INDEPENDENT_AMBULATORY_CARE_PROVIDER_SITE_OTHER): Payer: BLUE CROSS/BLUE SHIELD | Admitting: Orthopedic Surgery

## 2016-07-17 VITALS — BP 142/97 | HR 76 | Ht 62.0 in | Wt 230.0 lb

## 2016-07-17 DIAGNOSIS — M5442 Lumbago with sciatica, left side: Secondary | ICD-10-CM

## 2016-07-17 MED ORDER — PREDNISONE 10 MG (48) PO TBPK: ORAL_TABLET | Freq: Every day | ORAL | 1 refills | Status: DC

## 2016-07-17 MED ORDER — METHOCARBAMOL 500 MG PO TABS: 500.0000 mg | ORAL_TABLET | Freq: Three times a day (TID) | ORAL | 1 refills | Status: DC

## 2016-07-17 NOTE — Progress Notes (Signed)
Chief Complaint  Patient presents with  . Back Pain    Back pain.   HPI   50 years old presents with 2-3 month history of lower back pain radiating to her left knee and sometimes in the left foot causing some difficulty with walking associated with a catching in the left hip unrelieved by Aleve and Tylenol  Describes 8 out of 10 stabbing aching pain is worse with walking better with lying down. No history of lumbar disc problems denies any bowel or bladder dysfunction  Review of systems limb pain gait problem back pain  History of right knee arthroscopy 2016 carpal tunnel release right wrist 2013 2011 left carpal tunnel release.  Review of Systems  Constitutional: Negative for chills, fever and weight loss.  Respiratory: Negative for shortness of breath.   Cardiovascular: Negative for chest pain.  Gastrointestinal: Negative.   Genitourinary: Negative.   Neurological: Negative for tingling.    Past Medical History:  Diagnosis Date  . Anxiety   . Arthritis   . Contraceptive management 09/30/2013  . Depression   . Elevated cholesterol 06/08/2015  . GERD (gastroesophageal reflux disease)   . Helicobacter pylori gastritis   . Hypercholesteremia   . Menstrual headache 09/30/2013  . PONV (postoperative nausea and vomiting)     Past Surgical History:  Procedure Laterality Date  . CARPAL TUNNEL RELEASE Bilateral   . CHOLECYSTECTOMY    . COLONOSCOPY  10/14/2012   [id number] internal hemorrhoids/Mild diverticulosis/Two sessile polyps ranging between 3-21mm in size. HYPERPLASTIC POLYPS. SCREENING in 2023.   Marland Kitchen ESOPHAGOGASTRODUODENOSCOPY  10/14/2012   [id number] inflammation was found in the duodenal bulb/Non-erosive gastritis (inflammation) +H.PYLORI  . KNEE ARTHROSCOPY WITH MEDIAL MENISECTOMY Right 07/23/2015   Procedure: KNEE ARTHROSCOPY WITH MEDIAL MENISECTOMY;  Surgeon: Carole Civil, MD;  Location: [facility];  Service: Orthopedics;  Laterality: Right;   Family History  Problem  Relation Age of Onset  . Colon cancer Neg Hx   . Thyroid disease Mother   . Hyperlipidemia Mother   . Hyperlipidemia Maternal Grandmother   . Heart disease Maternal Grandmother   . Heart disease Maternal Grandfather    Social History  Substance Use Topics  . Smoking status: Never Smoker  . Smokeless tobacco: Never Used  . Alcohol use No   No outpatient prescriptions have been marked as taking for the 07/17/16 encounter (Office Visit) with Carole Civil, MD.    BP (!) 142/97   Pulse 76   Ht 5\' 2"  (1.575 m)   Wt 230 lb (104.3 kg)   BMI 42.07 kg/m   Physical Exam  Constitutional: She is oriented to person, place, and time. She appears well-developed and well-nourished. No distress.  Cardiovascular: Normal rate and intact distal pulses.   Neurological: She is alert and oriented to person, place, and time. She has normal reflexes. She exhibits normal muscle tone. Coordination normal.  Skin: Skin is warm and dry. No rash noted. She is not diaphoretic. No erythema. No pallor.  Psychiatric: She has a normal mood and affect. Her behavior is normal. Judgment and thought content normal.   Endomorphic body habitus Ortho Exam  Both lower extremities exhibit normal strength to confrontation testing throughout both lower extremities. Hip range of motion remains normal in each leg. She does have tenderness in the lower back and left SI joint left buttock with a positive straight leg raise on the left negative on the right negative femoral nerve stretch test. 2+ reflexes knee and ankle bilaterally equal. No sensory  loss in either leg. Skin normal in both lower extremities. Ambulatory status is associated with a left lower extremity gait abnormality   ASSESSMENT: My personal interpretation of the images:  Plain films show upper level lumbar degenerative disc changes with coronal plane malalignment appears to be reactive  PLAN Probable bulging disc  Recommend prednisone Dosepak and  muscle relaxer continue meloxicam start physical therapy follow-up in 4 weeks patient did not want a work note  Arther Abbott, MD 07/17/2016 10:35 AM  .meds

## 2016-07-17 NOTE — Patient Instructions (Signed)
START PT    

## 2016-07-27 DIAGNOSIS — E559 Vitamin D deficiency, unspecified: Secondary | ICD-10-CM | POA: Diagnosis not present

## 2016-07-27 DIAGNOSIS — Z6841 Body Mass Index (BMI) 40.0 and over, adult: Secondary | ICD-10-CM | POA: Diagnosis not present

## 2016-07-27 DIAGNOSIS — E785 Hyperlipidemia, unspecified: Secondary | ICD-10-CM | POA: Diagnosis not present

## 2016-07-27 DIAGNOSIS — Z008 Encounter for other general examination: Secondary | ICD-10-CM | POA: Diagnosis not present

## 2016-07-28 ENCOUNTER — Encounter (HOSPITAL_COMMUNITY): Payer: Self-pay | Admitting: Physical Therapy

## 2016-07-28 ENCOUNTER — Ambulatory Visit (HOSPITAL_COMMUNITY): Payer: BLUE CROSS/BLUE SHIELD | Attending: Orthopedic Surgery | Admitting: Physical Therapy

## 2016-07-28 DIAGNOSIS — Z9889 Other specified postprocedural states: Secondary | ICD-10-CM | POA: Insufficient documentation

## 2016-07-28 DIAGNOSIS — M5416 Radiculopathy, lumbar region: Secondary | ICD-10-CM | POA: Insufficient documentation

## 2016-07-28 DIAGNOSIS — M25561 Pain in right knee: Secondary | ICD-10-CM | POA: Diagnosis not present

## 2016-07-28 DIAGNOSIS — M6281 Muscle weakness (generalized): Secondary | ICD-10-CM | POA: Insufficient documentation

## 2016-07-28 DIAGNOSIS — G8929 Other chronic pain: Secondary | ICD-10-CM | POA: Insufficient documentation

## 2016-07-28 NOTE — Patient Instructions (Addendum)
Backward Bend (Standing)    Arch backward to make hollow of back deeper. Hold ____ seconds. Repeat _10___ times per set. Do ___1_ sets per session. Do _2___ sessions per day.  http://orth.exer.us/178   Copyright  VHI. All rights reserved.  Hamstring Stretch: Active    Support behind right knee. Starting with knee bent, attempt to straighten knee until a comfortable stretch is felt in back of thigh. Hold ___30_ seconds. Repeat _21___ times per set. Do __1__ sets per session. Do __2__ sessions per day.  http://orth.exer.us/158   Copyright  VHI. All rights reserved.  Thoracolumbar Side-Bend: Single Arm (Standing)    Reach over head to left side with right arm until stretch is felt. Hold __2__ seconds. Relax. Repeat _10___ times per set. Do _1___ sets per session. Do ___2_ sessions per day.  http://orth.exer.us/262   Copyright  VHI. All rights reserved.

## 2016-07-28 NOTE — Therapy (Signed)
[name], [place], [address] Phone: [phone]   Fax:  [phone]  Physical Therapy Evaluation  Patient Details  Name: Pamela Roy MRN: [id number] Date of Birth: [birth year] Referring Provider: Arther Abbott   Encounter Date: 07/28/2016      PT End of Session - 07/28/16 1204    Visit Number 1   Number of Visits 8   Date for PT Re-Evaluation 08/27/16   Authorization - Visit Number 1   Authorization - Number of Visits 8   PT Start Time 1115   PT Stop Time 1203   PT Time Calculation (min) 48 min   Activity Tolerance Patient tolerated treatment well   Behavior During Therapy [facility] for tasks assessed/performed      Past Medical History:  Diagnosis Date  . Anxiety   . Arthritis   . Contraceptive management 09/30/2013  . Depression   . Elevated cholesterol 06/08/2015  . GERD (gastroesophageal reflux disease)   . Helicobacter pylori gastritis   . Hypercholesteremia   . Menstrual headache 09/30/2013  . PONV (postoperative nausea and vomiting)     Past Surgical History:  Procedure Laterality Date  . CARPAL TUNNEL RELEASE Bilateral   . CHOLECYSTECTOMY    . COLONOSCOPY  10/14/2012   [id number] internal hemorrhoids/Mild diverticulosis/Two sessile polyps ranging between 3-5mm in size. HYPERPLASTIC POLYPS. SCREENING in 2023.   Marland Kitchen ESOPHAGOGASTRODUODENOSCOPY  10/14/2012   [id number] inflammation was found in the duodenal bulb/Non-erosive gastritis (inflammation) +H.PYLORI  . KNEE ARTHROSCOPY WITH MEDIAL MENISECTOMY Right 07/23/2015   Procedure: KNEE ARTHROSCOPY WITH MEDIAL MENISECTOMY;  Surgeon: Carole Civil, MD;  Location: [facility];  Service: Orthopedics;  Laterality: Right;    There were no vitals filed for this visit.       Subjective Assessment - 07/28/16 1111    Subjective Ms. Ridling states that she has had progressive radiating pain down her Lt leg off and on  for the past several months.  She  has now been referred to physical therapy by her MD.  She has gotten to the point where she is not able to walk for a long period of time.       Limitations Sitting   How long can you sit comfortably? 30-45 minutes   How long can you stand comfortably? 30-45 minutes    How long can you walk comfortably? 20-25 minutes    Patient Stated Goals be able to walk longer, no fear of falling    Currently in Pain? No/denies  pt is on prednisone now;  Was 8/10 radiating to knee level    Aggravating Factors  activity   Pain Relieving Factors predinsone, ice, heat laying down    Effect of Pain on Daily Activities increases    Multiple Pain Sites No            [facility] PT Assessment - 07/28/16 0001      Assessment   Medical Diagnosis LT Radicular back pain   Referring Provider Arther Abbott    Onset Date/Surgical Date 07/09/16   Next MD Visit 08/14/2016   Prior Therapy none     Precautions   Precautions None     Restrictions   Weight Bearing Restrictions No     Balance Screen   Has the patient fallen in the past 6 months No   Has the patient had a decrease in activity level because of a fear of falling?  No   Is the patient reluctant to  leave their home because of a fear of falling?  No     Prior Function   Level of Independence Independent   Vocation Full time employment   Vocation Requirements sitting; walking    Leisure read,      Cognition   Overall Cognitive Status Within Functional Limits for tasks assessed     Observation/Other Assessments   Focus on Therapeutic Outcomes (FOTO)  63     Functional Tests   Functional tests Single leg stance;Sit to Stand     Single Leg Stance   Comments Lt 30 seconds; Rt 60 seconds      Sit to Stand   Comments 5 x in 8.93      Posture/Postural Control   Posture/Postural Control Postural limitations   Postural Limitations Increased lumbar lordosis;Increased thoracic kyphosis     ROM / Strength   AROM / PROM / Strength  AROM;Strength     AROM   AROM Assessment Site Lumbar   Lumbar Flexion fingers approximately 7 " from floor    Lumbar Extension 30   Lumbar - Right Side Bend 22   Lumbar - Left Side Bend 30     Strength   Strength Assessment Site Hip;Knee;Ankle   Right/Left Hip Right;Left   Right Hip Flexion 5/5   Right Hip Extension 5/5   Left Hip Flexion 5/5   Left Hip Extension 4/5   Left Hip ABduction 5/5   Right/Left Knee Right;Left   Right Knee Flexion 5/5   Right Knee Extension 5/5   Left Knee Flexion 5/5   Left Knee Extension 5/5   Right/Left Ankle Right;Left   Right Ankle Dorsiflexion 5/5   Left Ankle Dorsiflexion 5/5     Flexibility   Soft Tissue Assessment /Muscle Length yes   Hamstrings Rt 175; Lt 160                            PT Education - 07/28/16 1203    Education provided Yes   Education Details HEP   Person(s) Educated Patient   Methods Explanation   Comprehension Verbalized understanding;Returned demonstration          PT Short Term Goals - 07/28/16 1210      PT SHORT TERM GOAL #1   Title Pt to be able to verbalize and demonstrate proper posture and body mechanics to decrease the  stress on her low back   Time 2   Period Weeks   Status New     PT SHORT TERM GOAL #2   Title PT to report radicular sx no further than her Lt hip area to demonstrate decreased nerve irritation.    Time 2   Period Weeks   Status New     PT SHORT TERM GOAL #3   Title Pt to report that she is able to walk for 30-40 minutes without increased low back or Lt leg pain    Time 2   Period Weeks           PT Long Term Goals - 07/28/16 1212      PT LONG TERM GOAL #1   Title Pt to report no radicular symptoms of pain in the past week to allow pt to walk for up to an hour without difficulty   Time 4   Period Weeks   Status New     PT LONG TERM GOAL #2   Title Pt to be able to single leg  stance for 60 seconds bilaterally to allow pt to feel confident  walking on uneven surfaces    Time 4   Period Weeks   Status New     PT LONG TERM GOAL #3   Title Strength of Lt LE to be 5/5 to allow pt go up and down steps in a reciprocal manner without difficulty    Time 4   Period Weeks   Status New               Plan - 07/28/16 1205    Clinical Impression Statement Ms. Cuadrado is a 50 yo female with complaint of progressive Lt radiating pain without injury or incident.  She has been referred to skilled physical therapy; examination demonstrates decreased ROM, decreased balance , decreased core strength and increased pain.  Ms. Crees will benefit from skilled therapy to address these issues and improve her functional mobility.    Rehab Potential Good   PT Frequency 2x / week   PT Duration 4 weeks   PT Treatment/Interventions ADLs/Self Care Home Management;Functional mobility training;Therapeutic activities;Therapeutic exercise;Balance training;Patient/family education;Manual techniques   PT Next Visit Plan begin core exercises including bent knee raise, bridges, dead bug and clam.  If patient does well can do supine stabilization on rollar.   Consulted and Agree with Plan of Care Patient      Patient will benefit from skilled therapeutic intervention in order to improve the following deficits and impairments:  Decreased activity tolerance, Decreased balance, Difficulty walking, Decreased strength, Postural dysfunction, Improper body mechanics, Pain  Visit Diagnosis: Radiculopathy, lumbar region - Plan: PT plan of care cert/re-cert  Muscle weakness (generalized) - Plan: PT plan of care cert/re-cert     Problem List Patient Active Problem List   Diagnosis Date Noted  . Medial meniscus tear   . Depression 06/08/2015  . Elevated cholesterol 06/08/2015  . Stiffness of joint, shoulder region 03/26/2014  . Right shoulder pain 03/26/2014  . Menstrual headache 09/30/2013  . Contraceptive management 09/30/2013  . Contusion, elbow  02/18/2013  . Helicobacter pylori gastritis 01/09/2013  . Heme positive stool 10/07/2012  . GERD (gastroesophageal reflux disease) 10/07/2012    Rayetta Humphrey, PT CLT [phone] 07/28/2016, 12:19 PM  [place] [address] [place], [place], [address] Phone: [phone]   Fax:  [phone]  Name: SHARMANE DALTO MRN: [id number] Date of Birth: Apr 01, 1966

## 2016-07-31 ENCOUNTER — Ambulatory Visit (HOSPITAL_COMMUNITY): Payer: BLUE CROSS/BLUE SHIELD | Admitting: Physical Therapy

## 2016-07-31 DIAGNOSIS — M25561 Pain in right knee: Secondary | ICD-10-CM | POA: Diagnosis not present

## 2016-07-31 DIAGNOSIS — Z9889 Other specified postprocedural states: Secondary | ICD-10-CM

## 2016-07-31 DIAGNOSIS — G8929 Other chronic pain: Secondary | ICD-10-CM | POA: Diagnosis not present

## 2016-07-31 DIAGNOSIS — M5416 Radiculopathy, lumbar region: Secondary | ICD-10-CM | POA: Diagnosis not present

## 2016-07-31 DIAGNOSIS — M6281 Muscle weakness (generalized): Secondary | ICD-10-CM

## 2016-07-31 NOTE — Therapy (Signed)
[place], [place], [address] Phone: [phone]   Fax:  [phone]  Physical Therapy Treatment  Patient Details  Name: Pamela Roy MRN: [id number] Date of Birth: [birth year] Referring Provider: Arther Abbott   Encounter Date: 07/31/2016      PT End of Session - 07/31/16 0857    Visit Number 2   Number of Visits 8   Date for PT Re-Evaluation 08/27/16   Authorization - Visit Number 2   Authorization - Number of Visits 8   PT Start Time 0818   PT Stop Time 0900   PT Time Calculation (min) 42 min   Activity Tolerance Patient tolerated treatment well   Behavior During Therapy [facility] for tasks assessed/performed      Past Medical History:  Diagnosis Date  . Anxiety   . Arthritis   . Contraceptive management 09/30/2013  . Depression   . Elevated cholesterol 06/08/2015  . GERD (gastroesophageal reflux disease)   . Helicobacter pylori gastritis   . Hypercholesteremia   . Menstrual headache 09/30/2013  . PONV (postoperative nausea and vomiting)     Past Surgical History:  Procedure Laterality Date  . CARPAL TUNNEL RELEASE Bilateral   . CHOLECYSTECTOMY    . COLONOSCOPY  10/14/2012   [id number] internal hemorrhoids/Mild diverticulosis/Two sessile polyps ranging between 3-48mm in size. HYPERPLASTIC POLYPS. SCREENING in 2023.   Marland Kitchen ESOPHAGOGASTRODUODENOSCOPY  10/14/2012   [id number] inflammation was found in the duodenal bulb/Non-erosive gastritis (inflammation) +H.PYLORI  . KNEE ARTHROSCOPY WITH MEDIAL MENISECTOMY Right 07/23/2015   Procedure: KNEE ARTHROSCOPY WITH MEDIAL MENISECTOMY;  Surgeon: Carole Civil, MD;  Location: [facility];  Service: Orthopedics;  Laterality: Right;    There were no vitals filed for this visit.      Subjective Assessment - 07/31/16 0825    Subjective Pt reports she currently has not pain but had some radiating pain on Saturday after cleaning.  States she completed her standing  extensions and it helped decrease her symptoms.     Currently in Pain? No/denies                         [facility] Adult PT Treatment/Exercise - 07/31/16 0001      Lumbar Exercises: Stretches   Active Hamstring Stretch 3 reps;30 seconds   Active Hamstring Stretch Limitations supine with towel   Prone on Elbows Stretch 1 rep;60 seconds   Piriformis Stretch 2 reps;30 seconds   Piriformis Stretch Limitations seated     Lumbar Exercises: Supine   Ab Set 10 reps;3 seconds   Clam 10 reps   Bent Knee Raise 10 reps   Bridge 10 reps   Straight Leg Raise 10 reps                PT Education - 07/31/16 [phone]    Education Details given initial evaluation, reviewed HEP and goals and updated HEP   Person(s) Educated Patient   Methods Explanation;Demonstration;Tactile cues;Verbal cues;Handout   Comprehension Verbalized understanding;Returned demonstration;Verbal cues required;Tactile cues required;Need further instruction          PT Short Term Goals - 07/28/16 1210      PT SHORT TERM GOAL #1   Title Pt to be able to verbalize and demonstrate proper posture and body mechanics to decrease the  stress on her low back   Time 2   Period Weeks   Status New     PT SHORT TERM GOAL #2  Title PT to report radicular sx no further than her Lt hip area to demonstrate decreased nerve irritation.    Time 2   Period Weeks   Status New     PT SHORT TERM GOAL #3   Title Pt to report that she is able to walk for 30-40 minutes without increased low back or Lt leg pain    Time 2   Period Weeks           PT Long Term Goals - 07/28/16 1212      PT LONG TERM GOAL #1   Title Pt to report no radicular symptoms of pain in the past week to allow pt to walk for up to an hour without difficulty   Time 4   Period Weeks   Status New     PT LONG TERM GOAL #2   Title Pt to be able to single leg stance for 60 seconds bilaterally to allow pt to feel confident walking on uneven  surfaces    Time 4   Period Weeks   Status New     PT LONG TERM GOAL #3   Title Strength of Lt LE to be 5/5 to allow pt go up and down steps in a reciprocal manner without difficulty    Time 4   Period Weeks   Status New               Plan - 07/31/16 [id number]    Clinical Impression Statement Progressed stabilization exercises to include bridge, SLR, clams and bent knee raises.  Pt able to demonstrate correctly.  Added these to HEP.  Also instucted with seated hamstring stretch as alternative, and seated piriformis stretch.  Noted tightness in piriformis.  Continued to encourage extension and prone on elbow posturing.     Rehab Potential Good   PT Frequency 2x / week   PT Duration 4 weeks   PT Treatment/Interventions ADLs/Self Care Home Management;Functional mobility training;Therapeutic activities;Therapeutic exercise;Balance training;Patient/family education;Manual techniques   PT Next Visit Plan Progress core exercises promoting extension.     PT Home Exercise Plan given at initial eval: hamstring stretch, abdominal bracing and standing extension.  10/9:  bent knee raise, SLR, bridge and clams   Consulted and Agree with Plan of Care Patient      Patient will benefit from skilled therapeutic intervention in order to improve the following deficits and impairments:  Decreased activity tolerance, Decreased balance, Difficulty walking, Decreased strength, Postural dysfunction, Improper body mechanics, Pain  Visit Diagnosis: Radiculopathy, lumbar region  Muscle weakness (generalized)  S/P right knee arthroscopy  Chronic pain of right knee     Problem List Patient Active Problem List   Diagnosis Date Noted  . Medial meniscus tear   . Depression 06/08/2015  . Elevated cholesterol 06/08/2015  . Stiffness of joint, shoulder region 03/26/2014  . Right shoulder pain 03/26/2014  . Menstrual headache 09/30/2013  . Contraceptive management 09/30/2013  . Contusion, elbow  02/18/2013  . Helicobacter pylori gastritis 01/09/2013  . Heme positive stool 10/07/2012  . GERD (gastroesophageal reflux disease) 10/07/2012    Teena Irani, PTA/CLT [phone]  07/31/2016, 9:04 AM  [place] [address] [place], [place], [address] Phone: [phone]   Fax:  [phone]  Name: Pamela Roy MRN: [id number] Date of Birth: 01/16/1966

## 2016-07-31 NOTE — Patient Instructions (Signed)
Small Ball Marching Supine    Lie supine with, knees flexed. Raise one leg a few inches. Hold briefly, return and raise other leg. Keep hips stationary. Do _2__ sets of _10__ repetitions.   Straight Leg Raise    Tighten stomach and slowly raise locked right leg _10___ inches from floor. Repeat _10___ times per set. Do _2___ sessions per day.     Bridge    Lie back, legs bent. Inhale, pressing hips up. Keeping ribs in, lengthen lower back. Exhale, rolling down along spine from top.Repeat _10___ times. Do _2___ sessions per day.    Abduction / Adduction: Controlled Motion (Supine)    Repeat _10__ times. Repeat with other leg. Do _2__ sessions per day.complete slowly stabilizing core

## 2016-08-04 ENCOUNTER — Ambulatory Visit (HOSPITAL_COMMUNITY): Payer: BLUE CROSS/BLUE SHIELD | Admitting: Physical Therapy

## 2016-08-04 DIAGNOSIS — M25561 Pain in right knee: Secondary | ICD-10-CM | POA: Diagnosis not present

## 2016-08-04 DIAGNOSIS — M6281 Muscle weakness (generalized): Secondary | ICD-10-CM | POA: Diagnosis not present

## 2016-08-04 DIAGNOSIS — G8929 Other chronic pain: Secondary | ICD-10-CM | POA: Diagnosis not present

## 2016-08-04 DIAGNOSIS — M5416 Radiculopathy, lumbar region: Secondary | ICD-10-CM

## 2016-08-04 DIAGNOSIS — Z9889 Other specified postprocedural states: Secondary | ICD-10-CM | POA: Diagnosis not present

## 2016-08-04 NOTE — Therapy (Signed)
[place], [place], [address] Phone: [phone]   Fax:  [phone]  Physical Therapy Treatment  Patient Details  Name: Pamela Roy MRN: [id number] Date of Birth: [birth year] Referring Provider: Arther Abbott   Encounter Date: 08/04/2016      PT End of Session - 08/04/16 1438    Visit Number 3   Number of Visits 8   Date for PT Re-Evaluation 08/27/16   Authorization - Visit Number 2   Authorization - Number of Visits 8   PT Start Time [id number]   PT Stop Time 1509   PT Time Calculation (min) 40 min   Activity Tolerance Patient tolerated treatment well   Behavior During Therapy [facility] for tasks assessed/performed      Past Medical History:  Diagnosis Date  . Anxiety   . Arthritis   . Contraceptive management 09/30/2013  . Depression   . Elevated cholesterol 06/08/2015  . GERD (gastroesophageal reflux disease)   . Helicobacter pylori gastritis   . Hypercholesteremia   . Menstrual headache 09/30/2013  . PONV (postoperative nausea and vomiting)     Past Surgical History:  Procedure Laterality Date  . CARPAL TUNNEL RELEASE Bilateral   . CHOLECYSTECTOMY    . COLONOSCOPY  10/14/2012   [id number] internal hemorrhoids/Mild diverticulosis/Two sessile polyps ranging between 3-62mm in size. HYPERPLASTIC POLYPS. SCREENING in 2023.   Marland Kitchen ESOPHAGOGASTRODUODENOSCOPY  10/14/2012   [id number] inflammation was found in the duodenal bulb/Non-erosive gastritis (inflammation) +H.PYLORI  . KNEE ARTHROSCOPY WITH MEDIAL MENISECTOMY Right 07/23/2015   Procedure: KNEE ARTHROSCOPY WITH MEDIAL MENISECTOMY;  Surgeon: Carole Civil, MD;  Location: [facility];  Service: Orthopedics;  Laterality: Right;    There were no vitals filed for this visit.      Subjective Assessment - 08/04/16 1432    Subjective Pt reports things continue to improve. She has noticed less pain lately and has been doing her HEP. She is also trying to be  more aware of her posture during the day.    Patient Stated Goals be able to walk longer, no fear of falling    Currently in Pain? No/denies                         [facility] Adult PT Treatment/Exercise - 08/04/16 0001      Exercises   Exercises Other Exercises   Other Exercises  seated ab set with LE marching x10 each; Seated pelvic tilts x10     Lumbar Exercises: Stretches   Active Hamstring Stretch 2 reps;30 seconds   Active Hamstring Stretch Limitations standing    Piriformis Stretch 2 reps;30 seconds   Piriformis Stretch Limitations BLE, seated      Lumbar Exercises: Supine   Ab Set 15 reps;5 seconds   Bent Knee Raise 10 reps   Bent Knee Raise Limitations ab set, bent knee to extension over table   Bridge 10 reps   Bridge Limitations x2 sets    Straight Leg Raise 15 reps   Straight Leg Raises Limitations with ab set      Lumbar Exercises: Sidelying   Clam 15 reps   Clam Limitations green TB      Lumbar Exercises: Prone   Straight Leg Raise 10 reps   Straight Leg Raises Limitations with ab set                 PT Education - 08/04/16 1512    Education provided  Yes   Education Details technique with therex   Person(s) Educated Patient   Methods Explanation;Verbal cues;Tactile cues   Comprehension Verbalized understanding;Returned demonstration          PT Short Term Goals - 07/28/16 1210      PT SHORT TERM GOAL #1   Title Pt to be able to verbalize and demonstrate proper posture and body mechanics to decrease the  stress on her low back   Time 2   Period Weeks   Status New     PT SHORT TERM GOAL #2   Title PT to report radicular sx no further than her Lt hip area to demonstrate decreased nerve irritation.    Time 2   Period Weeks   Status New     PT SHORT TERM GOAL #3   Title Pt to report that she is able to walk for 30-40 minutes without increased low back or Lt leg pain    Time 2   Period Weeks           PT Long Term  Goals - 07/28/16 1212      PT LONG TERM GOAL #1   Title Pt to report no radicular symptoms of pain in the past week to allow pt to walk for up to an hour without difficulty   Time 4   Period Weeks   Status New     PT LONG TERM GOAL #2   Title Pt to be able to single leg stance for 60 seconds bilaterally to allow pt to feel confident walking on uneven surfaces    Time 4   Period Weeks   Status New     PT LONG TERM GOAL #3   Title Strength of Lt LE to be 5/5 to allow pt go up and down steps in a reciprocal manner without difficulty    Time 4   Period Weeks   Status New               Plan - 08/04/16 1500    Clinical Impression Statement Pt arrived today with headache and nausea, her BP was checked: 130/90 mmHg and symptoms were monitored throughout session. Continued with focus on therex to address LE flexibility and trunk stability. Pt able to perform all exercises in supine with good technique and minimal cues, but noting increased difficulty with progression to sitting activity. No updates made to HEP this visit. Will continue with current POC.   Rehab Potential Good   PT Frequency 2x / week   PT Duration 4 weeks   PT Treatment/Interventions ADLs/Self Care Home Management;Functional mobility training;Therapeutic activities;Therapeutic exercise;Balance training;Patient/family education;Manual techniques   PT Next Visit Plan progression of trunk stabilization/strength in sitting and standing positions   PT Home Exercise Plan given at initial eval: hamstring stretch, abdominal bracing and standing extension.  10/9:  bent knee raise, SLR, bridge and clams   Consulted and Agree with Plan of Care Patient      Patient will benefit from skilled therapeutic intervention in order to improve the following deficits and impairments:  Decreased activity tolerance, Decreased balance, Difficulty walking, Decreased strength, Postural dysfunction, Improper body mechanics, Pain  Visit  Diagnosis: Radiculopathy, lumbar region  Muscle weakness (generalized)     Problem List Patient Active Problem List   Diagnosis Date Noted  . Medial meniscus tear   . Depression 06/08/2015  . Elevated cholesterol 06/08/2015  . Stiffness of joint, shoulder region 03/26/2014  . Right shoulder pain 03/26/2014  .  Menstrual headache 09/30/2013  . Contraceptive management 09/30/2013  . Contusion, elbow 02/18/2013  . Helicobacter pylori gastritis 01/09/2013  . Heme positive stool 10/07/2012  . GERD (gastroesophageal reflux disease) 10/07/2012   3:17 PM,08/04/16 Elly Modena PT, DPT [facility] [place] [address] [place], [place], [address] Phone: [phone]   Fax:  [phone]  Name: Pamela Roy MRN: [id number] Date of Birth: May 23, 1966

## 2016-08-09 ENCOUNTER — Telehealth (HOSPITAL_COMMUNITY): Payer: Self-pay

## 2016-08-09 ENCOUNTER — Ambulatory Visit (HOSPITAL_COMMUNITY): Payer: BLUE CROSS/BLUE SHIELD

## 2016-08-09 NOTE — Telephone Encounter (Signed)
08/09/16 pt called and cancelled for this week - she said that she wasn't feeling well

## 2016-08-10 ENCOUNTER — Ambulatory Visit (HOSPITAL_COMMUNITY): Payer: BLUE CROSS/BLUE SHIELD | Admitting: Physical Therapy

## 2016-08-14 ENCOUNTER — Ambulatory Visit (INDEPENDENT_AMBULATORY_CARE_PROVIDER_SITE_OTHER): Payer: BLUE CROSS/BLUE SHIELD | Admitting: Orthopedic Surgery

## 2016-08-14 ENCOUNTER — Encounter: Payer: Self-pay | Admitting: Orthopedic Surgery

## 2016-08-14 ENCOUNTER — Other Ambulatory Visit: Payer: Self-pay | Admitting: Adult Health

## 2016-08-14 DIAGNOSIS — M5442 Lumbago with sciatica, left side: Secondary | ICD-10-CM | POA: Diagnosis not present

## 2016-08-14 DIAGNOSIS — G8929 Other chronic pain: Secondary | ICD-10-CM

## 2016-08-14 DIAGNOSIS — R928 Other abnormal and inconclusive findings on diagnostic imaging of breast: Secondary | ICD-10-CM

## 2016-08-14 NOTE — Progress Notes (Signed)
Patient ID: CHAYAH PIGNONE, female   DOB: Dec 03, 1965, 50 y.o.   MRN: [id number]  Chief Complaint  Patient presents with  . Follow-up    back pain    HPI Brooklee TOSHUA BORSA is a 50 y.o. female.   HPI  50 years old presents with 2-3 month history of lower back pain radiating to her left knee and sometimes in the left foot causing some difficulty with walking associated with a catching in the left hip unrelieved by Aleve and Tylenol   Describes 8 out of 10 stabbing aching pain is worse with walking better with lying down. No history of lumbar disc problems denies any bowel or bladder dysfunction   Review of systems limb pain gait problem back pain   History of right knee arthroscopy 2016 carpal tunnel release right wrist 2013 2011 left carpal tunnel release.   Review of Systems Review of Systems   Physical Exam There were no vitals taken for this visit.   Physical Exam  Mild tenderness lumbar spine. Mild hyperlordosis  Lower extremity strength right and left normal with normal tiptoe testing. No instability in the back. Sensation in both legs and pulses in both feet are normal  Ambulation normal  Mood and affect pleasant oriented to person place and time general appearance normal grooming and hygiene  Lower back pain improving with therapy\ Encounter Diagnosis  Name Primary?  . Chronic left-sided low back pain with left-sided sciatica Yes     Follow-up in December if no improvement MRI will be ordered

## 2016-08-16 ENCOUNTER — Ambulatory Visit (HOSPITAL_COMMUNITY): Payer: BLUE CROSS/BLUE SHIELD | Admitting: Physical Therapy

## 2016-08-16 DIAGNOSIS — M6281 Muscle weakness (generalized): Secondary | ICD-10-CM | POA: Diagnosis not present

## 2016-08-16 DIAGNOSIS — G8929 Other chronic pain: Secondary | ICD-10-CM | POA: Diagnosis not present

## 2016-08-16 DIAGNOSIS — M5416 Radiculopathy, lumbar region: Secondary | ICD-10-CM | POA: Diagnosis not present

## 2016-08-16 DIAGNOSIS — Z9889 Other specified postprocedural states: Secondary | ICD-10-CM | POA: Diagnosis not present

## 2016-08-16 DIAGNOSIS — M25561 Pain in right knee: Secondary | ICD-10-CM | POA: Diagnosis not present

## 2016-08-16 NOTE — Patient Instructions (Addendum)
Scapular Retraction (Prone)    Lie with arms at sides. Pinch shoulder blades together and raise arms a few inches from floor. Repeat ___10_ times per set. Do __1__ sets per session. Do __2__ sessions per day.  http://orth.exer.us/954   Copyright  VHI. All rights reserved.  Scapular: Retraction (Prone)    Holding __0__ pound weights, keep arms out from sides and elbows bent. Pull elbows back, pinching shoulder blades together. Repeat __10__ times per set. Do _1___ sets per session. Do __2__ sessions per day.  http://orth.exer.us/862   Copyright  VHI. All rights reserved.  Arm / Leg Lift: Opposite (Prone)    Lift right leg and opposite arm ___3_ inches from floor, keeping knee locked. Repeat __10__ times per set. Do ____ sets per session. Do __2__ sessions per day. 1 http://orth.exer.us/100   Copyright  VHI. All rights reserved.

## 2016-08-16 NOTE — Therapy (Signed)
[place], [place], [address] Phone: [phone]   Fax:  [phone]  Physical Therapy Treatment  Patient Details  Name: QUINNISHA EUSTAQUIO MRN: [id number] Date of Birth: [birth year] Referring Provider: Arther Abbott   Encounter Date: 08/16/2016      PT End of Session - 08/16/16 1515    Visit Number 4   Number of Visits 8   Date for PT Re-Evaluation 08/27/16   Authorization - Visit Number 4   Authorization - Number of Visits 8   PT Start Time [id number]   PT Stop Time 1515   PT Time Calculation (min) 50 min   Activity Tolerance Patient tolerated treatment well   Behavior During Therapy [facility] for tasks assessed/performed      Past Medical History:  Diagnosis Date  . Anxiety   . Arthritis   . Contraceptive management 09/30/2013  . Depression   . Elevated cholesterol 06/08/2015  . GERD (gastroesophageal reflux disease)   . Helicobacter pylori gastritis   . Hypercholesteremia   . Menstrual headache 09/30/2013  . PONV (postoperative nausea and vomiting)     Past Surgical History:  Procedure Laterality Date  . CARPAL TUNNEL RELEASE Bilateral   . CHOLECYSTECTOMY    . COLONOSCOPY  10/14/2012   [id number] internal hemorrhoids/Mild diverticulosis/Two sessile polyps ranging between 3-32mm in size. HYPERPLASTIC POLYPS. SCREENING in 2023.   Marland Kitchen ESOPHAGOGASTRODUODENOSCOPY  10/14/2012   [id number] inflammation was found in the duodenal bulb/Non-erosive gastritis (inflammation) +H.PYLORI  . KNEE ARTHROSCOPY WITH MEDIAL MENISECTOMY Right 07/23/2015   Procedure: KNEE ARTHROSCOPY WITH MEDIAL MENISECTOMY;  Surgeon: Carole Civil, MD;  Location: [facility];  Service: Orthopedics;  Laterality: Right;    There were no vitals filed for this visit.      Subjective Assessment - 08/16/16 1425    Subjective Pt did not come to treatment last week due to being ill but feels better now.     Patient Stated Goals be able to walk longer,  no fear of falling    Currently in Pain? No/denies               [facility] Adult PT Treatment/Exercise - 08/16/16 0001      Lumbar Exercises: Stretches   Prone on Elbows Stretch 5 reps     Lumbar Exercises: Standing   Heel Raises 10 reps   Functional Squats 10 reps   Other Standing Lumbar Exercises cevical retraction x 10     Lumbar Exercises: Supine   Ab Set --  5 reps each 6 pt abdominal    Bridge 10 reps   Bridge Limitations hold x 10 seconds    Isometric Hip Flexion 10 reps   Other Supine Lumbar Exercises t band decompression exercises      Lumbar Exercises: Prone   Straight Leg Raise 10 reps   Opposite Arm/Leg Raise Limitations 10   Other Prone Lumbar Exercises row x 10; shoulder extension x 10                 PT Education - 08/16/16 1515    Education provided Yes   Education Details new HEP   Person(s) Educated Patient   Methods Explanation   Comprehension Verbalized understanding;Returned demonstration          PT Short Term Goals - 07/28/16 1210      PT SHORT TERM GOAL #1   Title Pt to be able to verbalize and demonstrate proper posture and body mechanics to decrease  the  stress on her low back   Time 2   Period Weeks   Status New     PT SHORT TERM GOAL #2   Title PT to report radicular sx no further than her Lt hip area to demonstrate decreased nerve irritation.    Time 2   Period Weeks   Status New     PT SHORT TERM GOAL #3   Title Pt to report that she is able to walk for 30-40 minutes without increased low back or Lt leg pain    Time 2   Period Weeks           PT Long Term Goals - 07/28/16 1212      PT LONG TERM GOAL #1   Title Pt to report no radicular symptoms of pain in the past week to allow pt to walk for up to an hour without difficulty   Time 4   Period Weeks   Status New     PT LONG TERM GOAL #2   Title Pt to be able to single leg stance for 60 seconds bilaterally to allow pt to feel confident walking on uneven  surfaces    Time 4   Period Weeks   Status New     PT LONG TERM GOAL #3   Title Strength of Lt LE to be 5/5 to allow pt go up and down steps in a reciprocal manner without difficulty    Time 4   Period Weeks   Status New               Plan - 08/16/16 1517    Clinical Impression Statement Pt has returned after a week of being ill.  Treatment continued to focus on lumbar stabilization working on prone and beginning functional strengthening.    Rehab Potential Good   PT Frequency 2x / week   PT Duration 4 weeks   PT Treatment/Interventions ADLs/Self Care Home Management;Functional mobility training;Therapeutic activities;Therapeutic exercise;Balance training;Patient/family education;Manual techniques   PT Next Visit Plan progression of trunk stabilization/strength in sitting and standing positions   PT Home Exercise Plan given at initial eval: hamstring stretch, abdominal bracing and standing extension.  10/9:  bent knee raise, SLR, bridge and clams; t-band decompression ; prone opposite arm/lg raise; rows and shoulder extension.    Consulted and Agree with Plan of Care Patient      Patient will benefit from skilled therapeutic intervention in order to improve the following deficits and impairments:  Decreased activity tolerance, Decreased balance, Difficulty walking, Decreased strength, Postural dysfunction, Improper body mechanics, Pain  Visit Diagnosis: Radiculopathy, lumbar region  Muscle weakness (generalized)     Problem List Patient Active Problem List   Diagnosis Date Noted  . Medial meniscus tear   . Depression 06/08/2015  . Elevated cholesterol 06/08/2015  . Stiffness of joint, shoulder region 03/26/2014  . Right shoulder pain 03/26/2014  . Menstrual headache 09/30/2013  . Contraceptive management 09/30/2013  . Contusion, elbow 02/18/2013  . Helicobacter pylori gastritis 01/09/2013  . Heme positive stool 10/07/2012  . GERD (gastroesophageal reflux  disease) 10/07/2012    Rayetta Humphrey, PT CLT [phone] 08/16/2016, 3:21 PM  [facility] [address] [place], [place], [address] Phone: [phone]   Fax:  [phone]  Name: YESENA GREENSTEIN MRN: [id number] Date of Birth: 15-Dec-1965

## 2016-08-18 ENCOUNTER — Ambulatory Visit (HOSPITAL_COMMUNITY): Payer: BLUE CROSS/BLUE SHIELD | Admitting: Physical Therapy

## 2016-08-18 ENCOUNTER — Telehealth (HOSPITAL_COMMUNITY): Payer: Self-pay | Admitting: General Practice

## 2016-08-18 NOTE — Telephone Encounter (Signed)
10/27 cx because she said that an emergency had come up so we rescheduled for 10/31

## 2016-08-22 ENCOUNTER — Ambulatory Visit (HOSPITAL_COMMUNITY): Payer: BLUE CROSS/BLUE SHIELD | Admitting: Physical Therapy

## 2016-08-22 ENCOUNTER — Encounter (HOSPITAL_COMMUNITY): Payer: BLUE CROSS/BLUE SHIELD

## 2016-08-22 DIAGNOSIS — M6281 Muscle weakness (generalized): Secondary | ICD-10-CM | POA: Diagnosis not present

## 2016-08-22 DIAGNOSIS — Z9889 Other specified postprocedural states: Secondary | ICD-10-CM | POA: Diagnosis not present

## 2016-08-22 DIAGNOSIS — G8929 Other chronic pain: Secondary | ICD-10-CM | POA: Diagnosis not present

## 2016-08-22 DIAGNOSIS — M5416 Radiculopathy, lumbar region: Secondary | ICD-10-CM | POA: Diagnosis not present

## 2016-08-22 DIAGNOSIS — M25561 Pain in right knee: Secondary | ICD-10-CM | POA: Diagnosis not present

## 2016-08-22 NOTE — Therapy (Signed)
[place], [place], [address] Phone: [phone]   Fax:  [phone]  Physical Therapy Treatment  Patient Details  Name: Pamela Roy MRN: [id number] Date of Birth: 01/21/1966 Referring Provider: Arther Abbott   Encounter Date: 08/22/2016      PT End of Session - 08/22/16 0914    Visit Number 5   Number of Visits 8   Date for PT Re-Evaluation 08/27/16   Authorization - Visit Number 5   Authorization - Number of Visits 8   PT Start Time 0901   PT Stop Time 0940   PT Time Calculation (min) 39 min   Activity Tolerance Patient tolerated treatment well;No increased pain   Behavior During Therapy [facility] for tasks assessed/performed      Past Medical History:  Diagnosis Date  . Anxiety   . Arthritis   . Contraceptive management 09/30/2013  . Depression   . Elevated cholesterol 06/08/2015  . GERD (gastroesophageal reflux disease)   . Helicobacter pylori gastritis   . Hypercholesteremia   . Menstrual headache 09/30/2013  . PONV (postoperative nausea and vomiting)     Past Surgical History:  Procedure Laterality Date  . CARPAL TUNNEL RELEASE Bilateral   . CHOLECYSTECTOMY    . COLONOSCOPY  10/14/2012   [id number] internal hemorrhoids/Mild diverticulosis/Two sessile polyps ranging between 3-9mm in size. HYPERPLASTIC POLYPS. SCREENING in 2023.   Marland Kitchen ESOPHAGOGASTRODUODENOSCOPY  10/14/2012   [id number] inflammation was found in the duodenal bulb/Non-erosive gastritis (inflammation) +H.PYLORI  . KNEE ARTHROSCOPY WITH MEDIAL MENISECTOMY Right 07/23/2015   Procedure: KNEE ARTHROSCOPY WITH MEDIAL MENISECTOMY;  Surgeon: Carole Civil, MD;  Location: [facility];  Service: Orthopedics;  Laterality: Right;    There were no vitals filed for this visit.      Subjective Assessment - 08/22/16 0902    Subjective Pt reports things are going well. She has been doing her HEP. She is not having pain currently, but noticed  some low back pain at home before she went to work. She did some of her stretches and it went away.    Patient Stated Goals be able to walk longer, no fear of falling    Currently in Pain? No/denies                         [facility] Adult PT Treatment/Exercise - 08/22/16 0001      Exercises   Other Exercises  seated ab set with trun derotation, UE punches using red TB x15 each      Lumbar Exercises: Standing   Wall Slides 10 reps;2 seconds   Wall Slides Limitations x2 sets, with ab set    Shoulder Extension 10 reps;Both   Shoulder Extension Limitations x2 sets, ab set and verbal cues for proper technique   Other Standing Lumbar Exercises ab set with D1 PNF pattern with red TB 2x10 each     Lumbar Exercises: Supine   Bridge 10 reps   Bridge Limitations x5 sec hold, 2 sets; red TB around knees      Lumbar Exercises: Sidelying   Hip Abduction 15 reps   Hip Abduction Limitations against wall      Lumbar Exercises: Quadruped   [place]/Old Horse 10 reps   Single Arm Raise Left;Right;10 reps                PT Education - 08/22/16 0939    Education provided Yes   Education Details addition to  HEP; technique with therex   Person(s) Educated Patient   Methods Explanation;Handout   Comprehension Verbalized understanding;Returned demonstration          PT Short Term Goals - 07/28/16 1210      PT SHORT TERM GOAL #1   Title Pt to be able to verbalize and demonstrate proper posture and body mechanics to decrease the  stress on her low back   Time 2   Period Weeks   Status New     PT SHORT TERM GOAL #2   Title PT to report radicular sx no further than her Lt hip area to demonstrate decreased nerve irritation.    Time 2   Period Weeks   Status New     PT SHORT TERM GOAL #3   Title Pt to report that she is able to walk for 30-40 minutes without increased low back or Lt leg pain    Time 2   Period Weeks           PT Long Term Goals - 07/28/16 1212       PT LONG TERM GOAL #1   Title Pt to report no radicular symptoms of pain in the past week to allow pt to walk for up to an hour without difficulty   Time 4   Period Weeks   Status New     PT LONG TERM GOAL #2   Title Pt to be able to single leg stance for 60 seconds bilaterally to allow pt to feel confident walking on uneven surfaces    Time 4   Period Weeks   Status New     PT LONG TERM GOAL #3   Title Strength of Lt LE to be 5/5 to allow pt go up and down steps in a reciprocal manner without difficulty    Time 4   Period Weeks   Status New               Plan - 08/22/16 0916    Clinical Impression Statement Pt continues to make progress towards her goals with improved strength and activity tolerance. Introduced trunk stability exercises  in quadruped with pt able to perform with minimal cues this session for proper technique. Ended session without report of increased pain. Will continue with current POC.   Rehab Potential Good   PT Frequency 2x / week   PT Duration 4 weeks   PT Treatment/Interventions ADLs/Self Care Home Management;Functional mobility training;Therapeutic activities;Therapeutic exercise;Balance training;Patient/family education;Manual techniques   PT Next Visit Plan progression of trunk stabilization/strength in sitting and standing positions   PT Home Exercise Plan given at initial eval: hamstring stretch, abdominal bracing and standing extension.  10/9:  bent knee raise, SLR, bridge and clams   Consulted and Agree with Plan of Care Patient      Patient will benefit from skilled therapeutic intervention in order to improve the following deficits and impairments:  Decreased activity tolerance, Decreased balance, Difficulty walking, Decreased strength, Postural dysfunction, Improper body mechanics, Pain  Visit Diagnosis: Radiculopathy, lumbar region  Muscle weakness (generalized)     Problem List Patient Active Problem List   Diagnosis Date  Noted  . Medial meniscus tear   . Depression 06/08/2015  . Elevated cholesterol 06/08/2015  . Stiffness of joint, shoulder region 03/26/2014  . Right shoulder pain 03/26/2014  . Menstrual headache 09/30/2013  . Contraceptive management 09/30/2013  . Contusion, elbow 02/18/2013  . Helicobacter pylori gastritis 01/09/2013  . Heme positive stool 10/07/2012  .  GERD (gastroesophageal reflux disease) 10/07/2012    9:41 AM,08/22/16 Elly Modena PT, DPT [facility] [address] [place], [place], [address] Phone: [phone]   Fax:  [phone]  Name: Pamela Roy MRN: [id number] Date of Birth: [birth year]

## 2016-08-23 ENCOUNTER — Ambulatory Visit (HOSPITAL_COMMUNITY): Payer: BLUE CROSS/BLUE SHIELD | Attending: Orthopedic Surgery | Admitting: Physical Therapy

## 2016-08-23 DIAGNOSIS — M6281 Muscle weakness (generalized): Secondary | ICD-10-CM

## 2016-08-23 DIAGNOSIS — M5416 Radiculopathy, lumbar region: Secondary | ICD-10-CM

## 2016-08-23 NOTE — Therapy (Signed)
[place], [place], [address] Phone: [phone]   Fax:  [phone]  Physical Therapy Treatment  Patient Details  Name: Pamela Roy MRN: [id number] Date of Birth: April 30, 1966 Referring Provider: Arther Abbott   Encounter Date: 08/23/2016      PT End of Session - 08/23/16 1616    Visit Number 6   Number of Visits 8   Date for PT Re-Evaluation 08/27/16   Authorization - Visit Number 6   Authorization - Number of Visits 8   PT Start Time [id number]   PT Stop Time [id number]   PT Time Calculation (min) 40 min   Activity Tolerance Patient tolerated treatment well;No increased pain   Behavior During Therapy WFL for tasks assessed/performed      Past Medical History:  Diagnosis Date  . Anxiety   . Arthritis   . Contraceptive management 09/30/2013  . Depression   . Elevated cholesterol 06/08/2015  . GERD (gastroesophageal reflux disease)   . Helicobacter pylori gastritis   . Hypercholesteremia   . Menstrual headache 09/30/2013  . PONV (postoperative nausea and vomiting)     Past Surgical History:  Procedure Laterality Date  . CARPAL TUNNEL RELEASE Bilateral   . CHOLECYSTECTOMY    . COLONOSCOPY  10/14/2012   [id number] internal hemorrhoids/Mild diverticulosis/Two sessile polyps ranging between 3-67mm in size. HYPERPLASTIC POLYPS. SCREENING in 2023.   Marland Kitchen ESOPHAGOGASTRODUODENOSCOPY  10/14/2012   [id number] inflammation was found in the duodenal bulb/Non-erosive gastritis (inflammation) +H.PYLORI  . KNEE ARTHROSCOPY WITH MEDIAL MENISECTOMY Right 07/23/2015   Procedure: KNEE ARTHROSCOPY WITH MEDIAL MENISECTOMY;  Surgeon: Carole Civil, MD;  Location: [facility];  Service: Orthopedics;  Laterality: Right;    There were no vitals filed for this visit.      Subjective Assessment - 08/23/16 1351    Subjective Pt reports that she is sore today from her exercises performed yesterday. She has no other complaints  currently.    Patient Stated Goals be able to walk longer, no fear of falling    Currently in Pain? No/denies                         [facility] Adult PT Treatment/Exercise - 08/23/16 0001      Exercises   Other Exercises  Hip adductor stretch, standing 2x30 sec each; seated on dyna disc with no LE support, trunk rotation with green TB resistance 2x10 each     Lumbar Exercises: Stretches   Active Hamstring Stretch 3 reps;30 seconds   Active Hamstring Stretch Limitations 12" box    Quad Stretch 3 reps;30 seconds   Quad Stretch Limitations prone      Lumbar Exercises: Machines for Strengthening   Leg Press Single leg press 2x5 each, Level 34     Lumbar Exercises: Standing   Other Standing Lumbar Exercises ab set with D1 and D2 PNF pattern with green TB x20 each     Lumbar Exercises: Prone   Straight Leg Raise 15 reps   Straight Leg Raises Limitations x2 sets, with ab set                 PT Education - 08/23/16 1615    Education provided Yes   Education Details correct desk ergonomics; technique with therex; discussed noted improvements and POC, anticipating d/c in next 2 visits with advanced HEP to continue with strengthening program on her own    Person(s) Educated Patient  Methods Explanation;Demonstration;Verbal cues;Handout   Comprehension Verbalized understanding;Returned demonstration          PT Short Term Goals - 07/28/16 1210      PT SHORT TERM GOAL #1   Title Pt to be able to verbalize and demonstrate proper posture and body mechanics to decrease the  stress on her low back   Time 2   Period Weeks   Status New     PT SHORT TERM GOAL #2   Title PT to report radicular sx no further than her Lt hip area to demonstrate decreased nerve irritation.    Time 2   Period Weeks   Status New     PT SHORT TERM GOAL #3   Title Pt to report that she is able to walk for 30-40 minutes without increased low back or Lt leg pain    Time 2   Period  Weeks           PT Long Term Goals - 07/28/16 1212      PT LONG TERM GOAL #1   Title Pt to report no radicular symptoms of pain in the past week to allow pt to walk for up to an hour without difficulty   Time 4   Period Weeks   Status New     PT LONG TERM GOAL #2   Title Pt to be able to single leg stance for 60 seconds bilaterally to allow pt to feel confident walking on uneven surfaces    Time 4   Period Weeks   Status New     PT LONG TERM GOAL #3   Title Strength of Lt LE to be 5/5 to allow pt go up and down steps in a reciprocal manner without difficulty    Time 4   Period Weeks   Status New               Plan - 08/23/16 1617    Clinical Impression Statement Pt arrived with increased muscle soreness following yesterday's session. She was still able to complete all exercises during today's session without increased difficulty. She is doing well and able to perform all exercises of increasing difficulty. Ended session with discussion about her progress and provided handout to encourage proper set up of her desk at work to encourage proper mechanics throughout the day. Will anticipate d/c in next 2 visits assuming no new issues arise.    Rehab Potential Good   PT Frequency 2x / week   PT Duration 4 weeks   PT Treatment/Interventions ADLs/Self Care Home Management;Functional mobility training;Therapeutic activities;Therapeutic exercise;Balance training;Patient/family education;Manual techniques   PT Next Visit Plan progression of trunk stabilization/strength in sitting and standing positions   PT Home Exercise Plan given at initial eval: hamstring stretch, abdominal bracing and standing extension.  10/9:  bent knee raise, SLR, bridge and clams   Consulted and Agree with Plan of Care Patient      Patient will benefit from skilled therapeutic intervention in order to improve the following deficits and impairments:  Decreased activity tolerance, Decreased balance,  Difficulty walking, Decreased strength, Postural dysfunction, Improper body mechanics, Pain  Visit Diagnosis: Radiculopathy, lumbar region  Muscle weakness (generalized)     Problem List Patient Active Problem List   Diagnosis Date Noted  . Medial meniscus tear   . Depression 06/08/2015  . Elevated cholesterol 06/08/2015  . Stiffness of joint, shoulder region 03/26/2014  . Right shoulder pain 03/26/2014  . Menstrual headache 09/30/2013  . Contraceptive  management 09/30/2013  . Contusion, elbow 02/18/2013  . Helicobacter pylori gastritis 01/09/2013  . Heme positive stool 10/07/2012  . GERD (gastroesophageal reflux disease) 10/07/2012    4:22 PM,08/23/16 Elly Modena PT, DPT [facility] [place] [address] [place], [place], [address] Phone: [phone]   Fax:  [phone]  Name: Pamela Roy MRN: [id number] Date of Birth: 1965-11-05

## 2016-08-25 ENCOUNTER — Ambulatory Visit (HOSPITAL_COMMUNITY): Payer: BLUE CROSS/BLUE SHIELD | Admitting: Physical Therapy

## 2016-08-29 ENCOUNTER — Ambulatory Visit (HOSPITAL_COMMUNITY)
Admission: RE | Admit: 2016-08-29 | Discharge: 2016-08-29 | Disposition: A | Discharge status: Home / Self Care | Payer: BLUE CROSS/BLUE SHIELD | Source: Ambulatory Visit | Attending: Adult Health | Admitting: Adult Health

## 2016-08-29 DIAGNOSIS — R928 Other abnormal and inconclusive findings on diagnostic imaging of breast: Secondary | ICD-10-CM | POA: Diagnosis not present

## 2016-08-30 ENCOUNTER — Ambulatory Visit (HOSPITAL_COMMUNITY): Payer: BLUE CROSS/BLUE SHIELD

## 2016-08-30 DIAGNOSIS — M6281 Muscle weakness (generalized): Secondary | ICD-10-CM | POA: Diagnosis not present

## 2016-08-30 DIAGNOSIS — M5416 Radiculopathy, lumbar region: Secondary | ICD-10-CM

## 2016-08-30 NOTE — Therapy (Signed)
PHYSICAL THERAPY DISCHARGE SUMMARY  Visits from Start of Care: 7  Current functional level related to goals / functional outcomes: *see below   Remaining deficits: *see below   Education / Equipment: *see below Plan: Patient agrees to discharge.  Patient goals were met. Patient is being discharged due to meeting the stated rehab goals.  ?????          2:26 PM, 08/30/16 [name], PT, DPT Physical Therapist at [place] [phone] (office)         [place] [address] [place], [place], [address] Phone: [phone]   Fax:  [phone]  Physical Therapy Treatment  Patient Details  Name: Pamela Roy MRN: [id number] Date of Birth: [birth year] Referring Provider: Arther Abbott   Encounter Date: 08/30/2016      PT End of Session - 08/30/16 1420    Visit Number 7   Number of Visits 8   Date for PT Re-Evaluation 08/27/16   Authorization - Visit Number 7   Authorization - Number of Visits 8   PT Start Time 8280   PT Stop Time 0349   PT Time Calculation (min) 29 min   Activity Tolerance Patient tolerated treatment well;No increased pain   Behavior During Therapy WFL for tasks assessed/performed      Past Medical History:  Diagnosis Date  . Anxiety   . Arthritis   . Contraceptive management 09/30/2013  . Depression   . Elevated cholesterol 06/08/2015  . GERD (gastroesophageal reflux disease)   . Helicobacter pylori gastritis   . Hypercholesteremia   . Menstrual headache 09/30/2013  . PONV (postoperative nausea and vomiting)     Past Surgical History:  Procedure Laterality Date  . CARPAL TUNNEL RELEASE Bilateral   . CHOLECYSTECTOMY    . COLONOSCOPY  10/14/2012   ZPH:[id number] internal hemorrhoids/Mild diverticulosis/Two sessile polyps ranging between 3-75m in size. HYPERPLASTIC POLYPS. SCREENING in 2023.   .Marland KitchenESOPHAGOGASTRODUODENOSCOPY  10/14/2012   [id number] inflammation was found in the duodenal bulb/Non-erosive gastritis (inflammation) +H.PYLORI  . KNEE ARTHROSCOPY WITH MEDIAL MENISECTOMY Right 07/23/2015   Procedure: KNEE ARTHROSCOPY WITH MEDIAL MENISECTOMY;  Surgeon: SCarole Civil MD;  Location: [facility];  Service: Orthopedics;  Laterality: Right;    There were no vitals filed for this visit.      Subjective Assessment - 08/30/16 1350    Subjective Pt reports she is doing well. She says pain has decreased substantially. Pt is able to perform her top priority ADL, IADL without limitation but has not yet tried to push her function.    How long can you sit comfortably? Sitting is unlimited at this time, was 30-45 miuntes at evaluation   How long can you stand comfortably? Standing is unlimited at this time, was 30-45 miuntes at evaluation. Pt was able to stand 3.5 hours to see [name]    How long can you walk comfortably? Unlimited,but has not tried to do so beyond 1-2 hours. (previosuly 20-25 minutes)    Patient Stated Goals be able to walk longer, no fear of falling    Currently in Pain? No/denies            [facility] - [facility]      Assessment   Medical Diagnosis LT Radicular back pain   Referring Provider SArther Abbott   Onset Date/Surgical Date 07/09/16   Next MD Visit 09/25/16   Prior Therapy none  Precautions   Precautions None     Restrictions   Weight Bearing Restrictions No     Balance Screen   Has the patient fallen in the past 6 months No   Has the patient had a decrease in activity level because of a fear of falling?  No   Is the patient reluctant to leave their home because of a fear of falling?  No     Prior Function   Level of Independence Independent   Vocation Full time employment   Vocation Requirements sitting; walking    Leisure read,      Functional Tests   Functional tests Single leg stance;Sit to Stand     Single Leg Stance   Comments Left: 60s ; Right not tested  (WNL at evaluation)   At evaluation: Rt: 60+s, Lt:30s.      Sit to Stand   Comments 5xSTS: 9.4s;  30sChairRise: 16x   5xSTS: 8.93s at evaluation, painfree     AROM   Lumbar Flexion Fingers 5.75inches from floor.  Fingers 7 " from floor at eval   Lumbar - Right Side Bend 26  22 at eval   Lumbar - Left Side Bend 21  30 at eval     Strength   Left Hip Extension 5/5  was 4/5 at eval     Flexibility   Hamstrings Rt: 157; Lt: 172  Rt: 160; Lt: 170 at eval                               PT Short Term Goals - 08/30/16 1407      PT SHORT TERM GOAL #1   Title Pt to be able to verbalize and demonstrate proper posture and body mechanics to decrease the  stress on her low back   Time 2   Period Weeks   Status Achieved     PT SHORT TERM GOAL #2   Title PT to report radicular sx no further than her Lt hip area to demonstrate decreased nerve irritation.    Time 2   Period Weeks   Status Achieved     PT SHORT TERM GOAL #3   Title Pt to report that she is able to walk for 30-40 minutes without increased low back or Lt leg pain    Time 2   Period Weeks   Status Achieved           PT Long Term Goals - 08/30/16 1408      PT LONG TERM GOAL #1   Title Pt to report no radicular symptoms of pain in the past week to allow pt to walk for up to an hour without difficulty   Time 4   Period Weeks   Status Achieved     PT LONG TERM GOAL #2   Title Pt to be able to single leg stance for 60 seconds bilaterally to allow pt to feel confident walking on uneven surfaces    Time 4   Period Weeks   Status Achieved     PT LONG TERM GOAL #3   Title Strength of Lt LE to be 5/5 to allow pt go up and down steps in a reciprocal manner without difficulty    Time 4   Period Weeks   Status Achieved               Plan - 08/30/16 1421    Clinical Impression Statement  Reassessment today, pt achieving all goals, now without limitations, and performing all ADL, IADL,  and leisure at [facility]. Examination reveals resolution of Left hip extension weakness, and Left SLS balance from 30s to 60sec. Pt is independent with HEP for DC. She reports no questions at this time and feels that she is ready for DC at this time. PT is agreeable, that patient is appropriate for DC, no additional PT needed.    Rehab Potential Poor   PT Frequency 2x / week   PT Duration 4 weeks   PT Treatment/Interventions ADLs/Self Care Home Management;Functional mobility training;Therapeutic activities;Therapeutic exercise;Balance training;Patient/family education;Manual techniques   PT Next Visit Plan progression of trunk stabilization/strength in sitting and standing positions   PT Home Exercise Plan given at initial eval: hamstring stretch, abdominal bracing and standing extension.  10/9:  bent knee raise, SLR, bridge and clams; At DC: updated HS stretch    Consulted and Agree with Plan of Care Patient      Patient will benefit from skilled therapeutic intervention in order to improve the following deficits and impairments:  Decreased activity tolerance, Decreased balance, Difficulty walking, Decreased strength, Postural dysfunction, Improper body mechanics, Pain  Visit Diagnosis: Radiculopathy, lumbar region - Plan: PT plan of care cert/re-cert  Muscle weakness (generalized) - Plan: PT plan of care cert/re-cert     Problem List Patient Active Problem List   Diagnosis Date Noted  . Medial meniscus tear   . Depression 06/08/2015  . Elevated cholesterol 06/08/2015  . Stiffness of joint, shoulder region 03/26/2014  . Right shoulder pain 03/26/2014  . Menstrual headache 09/30/2013  . Contraceptive management 09/30/2013  . Contusion, elbow 02/18/2013  . Helicobacter pylori gastritis 01/09/2013  . Heme positive stool 10/07/2012  . GERD (gastroesophageal reflux disease) 10/07/2012   2:26 PM, 08/30/16 [name], PT, DPT Physical Therapist at [place] [phone] (office)     [place] [address] [place], [place], [address] Phone: [phone]   Fax:  [phone]  Name: DUBLIN CANTERO MRN: [id number] Date of Birth: 06-Sep-1966

## 2016-09-01 ENCOUNTER — Encounter (HOSPITAL_COMMUNITY): Payer: BLUE CROSS/BLUE SHIELD | Admitting: Physical Therapy

## 2016-09-25 ENCOUNTER — Ambulatory Visit: Payer: BLUE CROSS/BLUE SHIELD | Admitting: Orthopedic Surgery

## 2016-09-29 ENCOUNTER — Encounter: Payer: Self-pay | Admitting: Orthopedic Surgery

## 2016-09-29 ENCOUNTER — Ambulatory Visit (INDEPENDENT_AMBULATORY_CARE_PROVIDER_SITE_OTHER): Payer: BLUE CROSS/BLUE SHIELD | Admitting: Orthopedic Surgery

## 2016-09-29 DIAGNOSIS — M5442 Lumbago with sciatica, left side: Secondary | ICD-10-CM

## 2016-09-29 DIAGNOSIS — G8929 Other chronic pain: Secondary | ICD-10-CM

## 2016-09-29 NOTE — Progress Notes (Signed)
Patient ID: Pamela Roy, female   DOB: 06-04-1966, 50 y.o.   MRN: [id number]  Chief Complaint  Patient presents with  . Follow-up    back pain    HPI Pamela Roy is a 50 y.o. female.   HPI  History of the lower back and left-sided leg pain improved with physical therapy  Review of Systems Review of Systems   Physical Exam  Physical Exam  alert and oriented 3 mood and affect normal  Inspection reveals no malalignment in the lower back and there is no tenderness Motor exam remains normal  Back remained stable.   Patient walks without assistive devices  Neurovascular exam remains normal  Encounter Diagnosis  Name Primary?  . Chronic left-sided low back pain with left-sided sciatica Yes    Improved warning signs given for spinal cord compression  Follow-up as needed

## 2016-10-12 ENCOUNTER — Other Ambulatory Visit: Payer: Self-pay | Admitting: Adult Health

## 2016-10-12 DIAGNOSIS — Z23 Encounter for immunization: Secondary | ICD-10-CM | POA: Diagnosis not present

## 2016-11-09 DIAGNOSIS — Z008 Encounter for other general examination: Secondary | ICD-10-CM | POA: Diagnosis not present

## 2016-11-09 DIAGNOSIS — Z6841 Body Mass Index (BMI) 40.0 and over, adult: Secondary | ICD-10-CM | POA: Diagnosis not present

## 2016-11-09 DIAGNOSIS — E785 Hyperlipidemia, unspecified: Secondary | ICD-10-CM | POA: Diagnosis not present

## 2016-11-09 DIAGNOSIS — E559 Vitamin D deficiency, unspecified: Secondary | ICD-10-CM | POA: Diagnosis not present

## 2016-12-10 ENCOUNTER — Other Ambulatory Visit: Payer: Self-pay | Admitting: Adult Health

## 2016-12-14 DIAGNOSIS — F329 Major depressive disorder, single episode, unspecified: Secondary | ICD-10-CM | POA: Diagnosis not present

## 2017-01-18 DIAGNOSIS — E785 Hyperlipidemia, unspecified: Secondary | ICD-10-CM | POA: Diagnosis not present

## 2017-01-18 DIAGNOSIS — Z139 Encounter for screening, unspecified: Secondary | ICD-10-CM | POA: Diagnosis not present

## 2017-01-18 DIAGNOSIS — E559 Vitamin D deficiency, unspecified: Secondary | ICD-10-CM | POA: Diagnosis not present

## 2017-01-18 DIAGNOSIS — Z79899 Other long term (current) drug therapy: Secondary | ICD-10-CM | POA: Diagnosis not present

## 2017-01-25 DIAGNOSIS — E559 Vitamin D deficiency, unspecified: Secondary | ICD-10-CM | POA: Diagnosis not present

## 2017-01-25 DIAGNOSIS — Z719 Counseling, unspecified: Secondary | ICD-10-CM | POA: Diagnosis not present

## 2017-01-25 DIAGNOSIS — Z008 Encounter for other general examination: Secondary | ICD-10-CM | POA: Diagnosis not present

## 2017-01-25 DIAGNOSIS — F329 Major depressive disorder, single episode, unspecified: Secondary | ICD-10-CM | POA: Diagnosis not present

## 2017-01-25 DIAGNOSIS — Z6841 Body Mass Index (BMI) 40.0 and over, adult: Secondary | ICD-10-CM | POA: Diagnosis not present

## 2017-01-25 DIAGNOSIS — E785 Hyperlipidemia, unspecified: Secondary | ICD-10-CM | POA: Diagnosis not present

## 2017-04-17 DIAGNOSIS — N3001 Acute cystitis with hematuria: Secondary | ICD-10-CM | POA: Diagnosis not present

## 2017-05-09 DIAGNOSIS — Z0000 Encounter for general adult medical examination without abnormal findings: Secondary | ICD-10-CM | POA: Diagnosis not present

## 2017-05-09 DIAGNOSIS — M150 Primary generalized (osteo)arthritis: Secondary | ICD-10-CM | POA: Diagnosis not present

## 2017-05-09 DIAGNOSIS — F419 Anxiety disorder, unspecified: Secondary | ICD-10-CM | POA: Diagnosis not present

## 2017-05-09 DIAGNOSIS — E559 Vitamin D deficiency, unspecified: Secondary | ICD-10-CM | POA: Diagnosis not present

## 2017-05-13 ENCOUNTER — Other Ambulatory Visit: Payer: Self-pay | Admitting: Adult Health

## 2017-05-24 DIAGNOSIS — E785 Hyperlipidemia, unspecified: Secondary | ICD-10-CM | POA: Diagnosis not present

## 2017-05-24 DIAGNOSIS — F329 Major depressive disorder, single episode, unspecified: Secondary | ICD-10-CM | POA: Diagnosis not present

## 2017-05-24 DIAGNOSIS — Z6841 Body Mass Index (BMI) 40.0 and over, adult: Secondary | ICD-10-CM | POA: Diagnosis not present

## 2017-05-24 DIAGNOSIS — E559 Vitamin D deficiency, unspecified: Secondary | ICD-10-CM | POA: Diagnosis not present

## 2017-05-24 DIAGNOSIS — Z719 Counseling, unspecified: Secondary | ICD-10-CM | POA: Diagnosis not present

## 2017-05-24 DIAGNOSIS — Z008 Encounter for other general examination: Secondary | ICD-10-CM | POA: Diagnosis not present

## 2017-05-31 DIAGNOSIS — F329 Major depressive disorder, single episode, unspecified: Secondary | ICD-10-CM | POA: Diagnosis not present

## 2017-06-21 DIAGNOSIS — F329 Major depressive disorder, single episode, unspecified: Secondary | ICD-10-CM | POA: Diagnosis not present

## 2017-07-12 DIAGNOSIS — Z139 Encounter for screening, unspecified: Secondary | ICD-10-CM | POA: Diagnosis not present

## 2017-07-12 DIAGNOSIS — E785 Hyperlipidemia, unspecified: Secondary | ICD-10-CM | POA: Diagnosis not present

## 2017-07-12 DIAGNOSIS — E559 Vitamin D deficiency, unspecified: Secondary | ICD-10-CM | POA: Diagnosis not present

## 2017-07-12 DIAGNOSIS — Z79899 Other long term (current) drug therapy: Secondary | ICD-10-CM | POA: Diagnosis not present

## 2017-07-17 DIAGNOSIS — I10 Essential (primary) hypertension: Secondary | ICD-10-CM | POA: Diagnosis not present

## 2017-07-26 DIAGNOSIS — F329 Major depressive disorder, single episode, unspecified: Secondary | ICD-10-CM | POA: Diagnosis not present

## 2017-07-26 DIAGNOSIS — E559 Vitamin D deficiency, unspecified: Secondary | ICD-10-CM | POA: Diagnosis not present

## 2017-07-26 DIAGNOSIS — Z6841 Body Mass Index (BMI) 40.0 and over, adult: Secondary | ICD-10-CM | POA: Diagnosis not present

## 2017-07-26 DIAGNOSIS — Z719 Counseling, unspecified: Secondary | ICD-10-CM | POA: Diagnosis not present

## 2017-07-26 DIAGNOSIS — E785 Hyperlipidemia, unspecified: Secondary | ICD-10-CM | POA: Diagnosis not present

## 2017-07-26 DIAGNOSIS — Z008 Encounter for other general examination: Secondary | ICD-10-CM | POA: Diagnosis not present

## 2017-07-27 ENCOUNTER — Other Ambulatory Visit: Payer: Self-pay | Admitting: Adult Health

## 2017-08-02 DIAGNOSIS — I10 Essential (primary) hypertension: Secondary | ICD-10-CM | POA: Diagnosis not present

## 2017-08-23 DIAGNOSIS — Z6841 Body Mass Index (BMI) 40.0 and over, adult: Secondary | ICD-10-CM | POA: Diagnosis not present

## 2017-08-23 DIAGNOSIS — Z7689 Persons encountering health services in other specified circumstances: Secondary | ICD-10-CM | POA: Diagnosis not present

## 2017-08-30 DIAGNOSIS — F329 Major depressive disorder, single episode, unspecified: Secondary | ICD-10-CM | POA: Diagnosis not present

## 2017-08-30 DIAGNOSIS — Z6841 Body Mass Index (BMI) 40.0 and over, adult: Secondary | ICD-10-CM | POA: Diagnosis not present

## 2017-08-30 DIAGNOSIS — Z7689 Persons encountering health services in other specified circumstances: Secondary | ICD-10-CM | POA: Diagnosis not present

## 2017-08-30 DIAGNOSIS — I10 Essential (primary) hypertension: Secondary | ICD-10-CM | POA: Diagnosis not present

## 2017-09-04 ENCOUNTER — Other Ambulatory Visit: Payer: Self-pay | Admitting: Adult Health

## 2017-09-04 DIAGNOSIS — Z1231 Encounter for screening mammogram for malignant neoplasm of breast: Secondary | ICD-10-CM

## 2017-09-06 DIAGNOSIS — Z7689 Persons encountering health services in other specified circumstances: Secondary | ICD-10-CM | POA: Diagnosis not present

## 2017-09-06 DIAGNOSIS — I10 Essential (primary) hypertension: Secondary | ICD-10-CM | POA: Diagnosis not present

## 2017-09-06 DIAGNOSIS — Z6841 Body Mass Index (BMI) 40.0 and over, adult: Secondary | ICD-10-CM | POA: Diagnosis not present

## 2017-09-26 ENCOUNTER — Ambulatory Visit (HOSPITAL_COMMUNITY)
Admission: RE | Admit: 2017-09-26 | Discharge: 2017-09-26 | Disposition: A | Discharge status: Home / Self Care | Payer: BLUE CROSS/BLUE SHIELD | Source: Ambulatory Visit | Attending: Adult Health | Admitting: Adult Health

## 2017-09-26 DIAGNOSIS — Z1231 Encounter for screening mammogram for malignant neoplasm of breast: Secondary | ICD-10-CM | POA: Diagnosis not present

## 2017-10-04 ENCOUNTER — Other Ambulatory Visit: Payer: Self-pay

## 2017-10-04 ENCOUNTER — Encounter: Payer: Self-pay | Admitting: Adult Health

## 2017-10-04 ENCOUNTER — Ambulatory Visit (INDEPENDENT_AMBULATORY_CARE_PROVIDER_SITE_OTHER): Payer: BLUE CROSS/BLUE SHIELD | Admitting: Adult Health

## 2017-10-04 VITALS — BP 124/84 | HR 94 | Resp 20 | Ht 62.0 in | Wt 239.0 lb

## 2017-10-04 DIAGNOSIS — Z01419 Encounter for gynecological examination (general) (routine) without abnormal findings: Secondary | ICD-10-CM | POA: Diagnosis not present

## 2017-10-04 DIAGNOSIS — Z780 Asymptomatic menopausal state: Secondary | ICD-10-CM | POA: Insufficient documentation

## 2017-10-04 DIAGNOSIS — Z1212 Encounter for screening for malignant neoplasm of rectum: Secondary | ICD-10-CM | POA: Diagnosis not present

## 2017-10-04 DIAGNOSIS — Z1211 Encounter for screening for malignant neoplasm of colon: Secondary | ICD-10-CM

## 2017-10-04 LAB — HEMOCCULT GUIAC POC 1CARD (OFFICE): Fecal Occult Blood, POC: NEGATIVE

## 2017-10-04 NOTE — Progress Notes (Signed)
Patient ID: Pamela Roy, female   DOB: 1966-09-02, 51 y.o.   MRN: [id number] History of Present Illness:  Pamela Roy is a 51 year old white female, married, in for well woman gyn exam, she had normal pap with negative HPV 06/08/15. PCP is [facility])  Current Medications, Allergies, Past Medical History, Past Surgical History, Family History and Social History were reviewed in [place] record.     Review of Systems: Patient denies any headaches, hearing loss, fatigue, blurred vision, shortness of breath, chest pain, abdominal pain, problems with bowel movements, urination, or intercourse. No joint pain or mood swings. +hot flashes    Physical Exam:BP 124/84 (BP Location: Left Arm, Patient Position: Sitting, Cuff Size: Large)   Pulse 94   Resp 20   Ht 5\' 2"  (1.575 m)   Wt 239 lb (108.4 kg)   LMP  (LMP Unknown)   BMI 43.71 kg/m  General:  Well developed, well nourished, no acute distress Skin:  Warm and dry Neck:  Midline trachea, normal thyroid, good ROM, no lymphadenopathy Lungs; Clear to auscultation bilaterally Breast:  No dominant palpable mass, retraction, or nipple discharge Cardiovascular: Regular rate and rhythm Abdomen:  Soft, non tender, no hepatosplenomegaly Pelvic:  External genitalia is normal in appearance, no lesions.  The vagina is normal in appearance. Urethra has no lesions or masses. The cervix is smooth.  Uterus is felt to be normal size, shape, and contour.  No adnexal masses or tenderness noted.Bladder is non tender, no masses felt. Rectal: Good sphincter tone, no polyps, or hemorrhoids felt.  Hemoccult negative. Extremities/musculoskeletal:  No swelling or varicosities noted, no clubbing or cyanosis Psych:  No mood changes, alert and cooperative,seems happy PHQ 9 score 6, is on meds and denies being suicidal.stop OCs after this pack, use condoms and wait 4 weeks then check [place].   Impression:    1.  Encounter for well woman exam with routine gynecological exam   2. Screening for colorectal cancer   3. Menopause      Plan: Stop OCs after this pack Use condoms Check [facility] in 4 weeks after last pill Pap and physical in 1 year Mammogram yearly Labs at work  Colonoscopy per GI

## 2017-11-01 DIAGNOSIS — Z7689 Persons encountering health services in other specified circumstances: Secondary | ICD-10-CM | POA: Diagnosis not present

## 2017-11-01 DIAGNOSIS — Z008 Encounter for other general examination: Secondary | ICD-10-CM | POA: Diagnosis not present

## 2017-11-01 DIAGNOSIS — Z6841 Body Mass Index (BMI) 40.0 and over, adult: Secondary | ICD-10-CM | POA: Diagnosis not present

## 2017-11-01 DIAGNOSIS — Z719 Counseling, unspecified: Secondary | ICD-10-CM | POA: Diagnosis not present

## 2017-11-01 DIAGNOSIS — E669 Obesity, unspecified: Secondary | ICD-10-CM | POA: Diagnosis not present

## 2017-11-01 DIAGNOSIS — Z1389 Encounter for screening for other disorder: Secondary | ICD-10-CM | POA: Diagnosis not present

## 2017-11-01 DIAGNOSIS — E785 Hyperlipidemia, unspecified: Secondary | ICD-10-CM | POA: Diagnosis not present

## 2017-11-01 DIAGNOSIS — I10 Essential (primary) hypertension: Secondary | ICD-10-CM | POA: Diagnosis not present

## 2017-11-14 DIAGNOSIS — Z780 Asymptomatic menopausal state: Secondary | ICD-10-CM | POA: Diagnosis not present

## 2017-11-15 ENCOUNTER — Telehealth: Payer: Self-pay | Admitting: Adult Health

## 2017-11-15 LAB — FOLLICLE STIMULATING HORMONE: FSH: 78.1 m[IU]/mL

## 2017-11-15 NOTE — Telephone Encounter (Signed)
Pt aware that [facility] is 78.1 and that means she is postmenopausal

## 2017-11-22 DIAGNOSIS — Z7689 Persons encountering health services in other specified circumstances: Secondary | ICD-10-CM | POA: Diagnosis not present

## 2017-11-22 DIAGNOSIS — I10 Essential (primary) hypertension: Secondary | ICD-10-CM | POA: Diagnosis not present

## 2017-11-22 DIAGNOSIS — Z6841 Body Mass Index (BMI) 40.0 and over, adult: Secondary | ICD-10-CM | POA: Diagnosis not present

## 2017-11-22 DIAGNOSIS — E669 Obesity, unspecified: Secondary | ICD-10-CM | POA: Diagnosis not present

## 2018-01-17 DIAGNOSIS — E669 Obesity, unspecified: Secondary | ICD-10-CM | POA: Diagnosis not present

## 2018-01-17 DIAGNOSIS — Z6841 Body Mass Index (BMI) 40.0 and over, adult: Secondary | ICD-10-CM | POA: Diagnosis not present

## 2018-01-17 DIAGNOSIS — Z79899 Other long term (current) drug therapy: Secondary | ICD-10-CM | POA: Diagnosis not present

## 2018-01-17 DIAGNOSIS — E785 Hyperlipidemia, unspecified: Secondary | ICD-10-CM | POA: Diagnosis not present

## 2018-01-17 DIAGNOSIS — Z139 Encounter for screening, unspecified: Secondary | ICD-10-CM | POA: Diagnosis not present

## 2018-01-17 DIAGNOSIS — E559 Vitamin D deficiency, unspecified: Secondary | ICD-10-CM | POA: Diagnosis not present

## 2018-01-24 DIAGNOSIS — Z008 Encounter for other general examination: Secondary | ICD-10-CM | POA: Diagnosis not present

## 2018-01-24 DIAGNOSIS — Z6841 Body Mass Index (BMI) 40.0 and over, adult: Secondary | ICD-10-CM | POA: Diagnosis not present

## 2018-01-24 DIAGNOSIS — Z719 Counseling, unspecified: Secondary | ICD-10-CM | POA: Diagnosis not present

## 2018-01-24 DIAGNOSIS — I10 Essential (primary) hypertension: Secondary | ICD-10-CM | POA: Diagnosis not present

## 2018-02-21 DIAGNOSIS — Z6841 Body Mass Index (BMI) 40.0 and over, adult: Secondary | ICD-10-CM | POA: Diagnosis not present

## 2018-02-21 DIAGNOSIS — E669 Obesity, unspecified: Secondary | ICD-10-CM | POA: Diagnosis not present

## 2018-02-21 DIAGNOSIS — Z7689 Persons encountering health services in other specified circumstances: Secondary | ICD-10-CM | POA: Diagnosis not present

## 2018-03-14 DIAGNOSIS — Z7689 Persons encountering health services in other specified circumstances: Secondary | ICD-10-CM | POA: Diagnosis not present

## 2018-03-14 DIAGNOSIS — E669 Obesity, unspecified: Secondary | ICD-10-CM | POA: Diagnosis not present

## 2018-03-14 DIAGNOSIS — Z6841 Body Mass Index (BMI) 40.0 and over, adult: Secondary | ICD-10-CM | POA: Diagnosis not present

## 2018-05-13 DIAGNOSIS — Z0000 Encounter for general adult medical examination without abnormal findings: Secondary | ICD-10-CM | POA: Diagnosis not present

## 2018-05-23 DIAGNOSIS — Z719 Counseling, unspecified: Secondary | ICD-10-CM | POA: Diagnosis not present

## 2018-05-23 DIAGNOSIS — Z7689 Persons encountering health services in other specified circumstances: Secondary | ICD-10-CM | POA: Diagnosis not present

## 2018-05-23 DIAGNOSIS — Z008 Encounter for other general examination: Secondary | ICD-10-CM | POA: Diagnosis not present

## 2018-05-23 DIAGNOSIS — Z6841 Body Mass Index (BMI) 40.0 and over, adult: Secondary | ICD-10-CM | POA: Diagnosis not present

## 2018-07-18 DIAGNOSIS — Z7689 Persons encountering health services in other specified circumstances: Secondary | ICD-10-CM | POA: Diagnosis not present

## 2018-07-18 DIAGNOSIS — Z713 Dietary counseling and surveillance: Secondary | ICD-10-CM | POA: Diagnosis not present

## 2018-07-18 DIAGNOSIS — Z6841 Body Mass Index (BMI) 40.0 and over, adult: Secondary | ICD-10-CM | POA: Diagnosis not present

## 2018-07-18 DIAGNOSIS — E669 Obesity, unspecified: Secondary | ICD-10-CM | POA: Diagnosis not present

## 2018-07-25 DIAGNOSIS — Z23 Encounter for immunization: Secondary | ICD-10-CM | POA: Diagnosis not present

## 2018-08-15 DIAGNOSIS — Z719 Counseling, unspecified: Secondary | ICD-10-CM | POA: Diagnosis not present

## 2018-08-15 DIAGNOSIS — Z6841 Body Mass Index (BMI) 40.0 and over, adult: Secondary | ICD-10-CM | POA: Diagnosis not present

## 2018-08-15 DIAGNOSIS — Z008 Encounter for other general examination: Secondary | ICD-10-CM | POA: Diagnosis not present

## 2018-08-15 DIAGNOSIS — Z7689 Persons encountering health services in other specified circumstances: Secondary | ICD-10-CM | POA: Diagnosis not present

## 2018-11-08 DIAGNOSIS — I10 Essential (primary) hypertension: Secondary | ICD-10-CM | POA: Diagnosis not present

## 2018-11-08 DIAGNOSIS — F419 Anxiety disorder, unspecified: Secondary | ICD-10-CM | POA: Diagnosis not present

## 2018-11-08 DIAGNOSIS — R7303 Prediabetes: Secondary | ICD-10-CM | POA: Diagnosis not present

## 2018-11-08 DIAGNOSIS — E785 Hyperlipidemia, unspecified: Secondary | ICD-10-CM | POA: Diagnosis not present

## 2018-11-08 DIAGNOSIS — E559 Vitamin D deficiency, unspecified: Secondary | ICD-10-CM | POA: Diagnosis not present

## 2018-12-03 DIAGNOSIS — E669 Obesity, unspecified: Secondary | ICD-10-CM | POA: Diagnosis not present

## 2018-12-03 DIAGNOSIS — Z713 Dietary counseling and surveillance: Secondary | ICD-10-CM | POA: Diagnosis not present

## 2019-04-15 ENCOUNTER — Other Ambulatory Visit (HOSPITAL_COMMUNITY): Payer: Self-pay | Admitting: Student

## 2019-04-15 DIAGNOSIS — Z1231 Encounter for screening mammogram for malignant neoplasm of breast: Secondary | ICD-10-CM

## 2019-04-28 ENCOUNTER — Ambulatory Visit (HOSPITAL_COMMUNITY)
Admission: RE | Admit: 2019-04-28 | Discharge: 2019-04-28 | Disposition: A | Discharge status: Home / Self Care | Payer: BC Managed Care – PPO | Source: Ambulatory Visit | Attending: Student | Admitting: Student

## 2019-04-28 ENCOUNTER — Other Ambulatory Visit: Payer: Self-pay

## 2019-04-28 DIAGNOSIS — Z1231 Encounter for screening mammogram for malignant neoplasm of breast: Secondary | ICD-10-CM | POA: Diagnosis not present

## 2019-06-24 DIAGNOSIS — Z139 Encounter for screening, unspecified: Secondary | ICD-10-CM | POA: Diagnosis not present

## 2019-06-24 DIAGNOSIS — Z0130 Encounter for examination of blood pressure without abnormal findings: Secondary | ICD-10-CM | POA: Diagnosis not present

## 2019-06-24 DIAGNOSIS — E559 Vitamin D deficiency, unspecified: Secondary | ICD-10-CM | POA: Diagnosis not present

## 2019-06-24 DIAGNOSIS — E785 Hyperlipidemia, unspecified: Secondary | ICD-10-CM | POA: Diagnosis not present

## 2019-06-24 DIAGNOSIS — Z79899 Other long term (current) drug therapy: Secondary | ICD-10-CM | POA: Diagnosis not present

## 2019-06-24 DIAGNOSIS — R7309 Other abnormal glucose: Secondary | ICD-10-CM | POA: Diagnosis not present

## 2019-07-03 DIAGNOSIS — E559 Vitamin D deficiency, unspecified: Secondary | ICD-10-CM | POA: Diagnosis not present

## 2019-07-03 DIAGNOSIS — I10 Essential (primary) hypertension: Secondary | ICD-10-CM | POA: Diagnosis not present

## 2019-07-03 DIAGNOSIS — E785 Hyperlipidemia, unspecified: Secondary | ICD-10-CM | POA: Diagnosis not present

## 2019-07-03 DIAGNOSIS — E538 Deficiency of other specified B group vitamins: Secondary | ICD-10-CM | POA: Diagnosis not present

## 2019-07-04 DIAGNOSIS — Z0000 Encounter for general adult medical examination without abnormal findings: Secondary | ICD-10-CM | POA: Diagnosis not present

## 2019-07-04 DIAGNOSIS — R5383 Other fatigue: Secondary | ICD-10-CM | POA: Diagnosis not present

## 2019-07-04 DIAGNOSIS — F419 Anxiety disorder, unspecified: Secondary | ICD-10-CM | POA: Diagnosis not present

## 2019-07-04 DIAGNOSIS — I10 Essential (primary) hypertension: Secondary | ICD-10-CM | POA: Diagnosis not present

## 2019-07-04 DIAGNOSIS — E559 Vitamin D deficiency, unspecified: Secondary | ICD-10-CM | POA: Diagnosis not present

## 2019-08-05 DIAGNOSIS — Z23 Encounter for immunization: Secondary | ICD-10-CM | POA: Diagnosis not present

## 2019-08-06 DIAGNOSIS — Z23 Encounter for immunization: Secondary | ICD-10-CM | POA: Diagnosis not present

## 2019-08-07 DIAGNOSIS — R7309 Other abnormal glucose: Secondary | ICD-10-CM | POA: Diagnosis not present

## 2019-08-07 DIAGNOSIS — Z6841 Body Mass Index (BMI) 40.0 and over, adult: Secondary | ICD-10-CM | POA: Diagnosis not present

## 2019-08-07 DIAGNOSIS — Z1389 Encounter for screening for other disorder: Secondary | ICD-10-CM | POA: Diagnosis not present

## 2019-09-03 DIAGNOSIS — Z7689 Persons encountering health services in other specified circumstances: Secondary | ICD-10-CM | POA: Diagnosis not present

## 2019-09-03 DIAGNOSIS — Z6841 Body Mass Index (BMI) 40.0 and over, adult: Secondary | ICD-10-CM | POA: Diagnosis not present

## 2019-09-03 DIAGNOSIS — E669 Obesity, unspecified: Secondary | ICD-10-CM | POA: Diagnosis not present

## 2019-09-24 DIAGNOSIS — Z7689 Persons encountering health services in other specified circumstances: Secondary | ICD-10-CM | POA: Diagnosis not present

## 2019-09-24 DIAGNOSIS — Z6841 Body Mass Index (BMI) 40.0 and over, adult: Secondary | ICD-10-CM | POA: Diagnosis not present

## 2019-09-24 DIAGNOSIS — E669 Obesity, unspecified: Secondary | ICD-10-CM | POA: Diagnosis not present

## 2019-09-30 DIAGNOSIS — B009 Herpesviral infection, unspecified: Secondary | ICD-10-CM | POA: Diagnosis not present

## 2019-11-20 DIAGNOSIS — Z6841 Body Mass Index (BMI) 40.0 and over, adult: Secondary | ICD-10-CM | POA: Diagnosis not present

## 2019-11-20 DIAGNOSIS — Z13220 Encounter for screening for lipoid disorders: Secondary | ICD-10-CM | POA: Diagnosis not present

## 2019-11-20 DIAGNOSIS — M25511 Pain in right shoulder: Secondary | ICD-10-CM | POA: Diagnosis not present

## 2019-11-20 DIAGNOSIS — R7309 Other abnormal glucose: Secondary | ICD-10-CM | POA: Diagnosis not present

## 2019-12-05 DIAGNOSIS — R944 Abnormal results of kidney function studies: Secondary | ICD-10-CM | POA: Diagnosis not present

## 2020-03-09 DIAGNOSIS — Z6841 Body Mass Index (BMI) 40.0 and over, adult: Secondary | ICD-10-CM | POA: Diagnosis not present

## 2020-03-09 DIAGNOSIS — M7591 Shoulder lesion, unspecified, right shoulder: Secondary | ICD-10-CM | POA: Diagnosis not present

## 2020-03-09 DIAGNOSIS — M7551 Bursitis of right shoulder: Secondary | ICD-10-CM | POA: Diagnosis not present

## 2020-03-23 ENCOUNTER — Telehealth: Payer: Self-pay | Admitting: Adult Health

## 2020-03-23 NOTE — Telephone Encounter (Signed)
Has visit for Pap scheduled for June 30. She had not had a period in a long time. She started a period over the weekend. It was pink at first and then some clotting. Sent message to [place] for her to let patient know what to do.

## 2020-03-23 NOTE — Telephone Encounter (Signed)
Patient called stating that she would like a call from [place]. Patient did not state the reason for the call. Please contact pt

## 2020-03-23 NOTE — Telephone Encounter (Signed)
Pt vaginal bleeding, PMB to come in 6/9 at 1:30 and will assess and get [place] ordered.

## 2020-03-31 ENCOUNTER — Ambulatory Visit: Payer: BC Managed Care – PPO | Admitting: Adult Health

## 2020-03-31 ENCOUNTER — Encounter: Payer: Self-pay | Admitting: Adult Health

## 2020-03-31 VITALS — BP 134/84 | HR 88 | Ht 62.0 in | Wt 252.0 lb

## 2020-03-31 DIAGNOSIS — N950 Postmenopausal bleeding: Secondary | ICD-10-CM | POA: Diagnosis not present

## 2020-03-31 NOTE — Patient Instructions (Signed)
Postmenopausal Bleeding  Postmenopausal bleeding is any bleeding that occurs after menopause. Menopause is when a woman's period stops. Any type of bleeding after menopause should be checked by your doctor. Treatment will depend on the cause. Follow these instructions at home:  Pay attention to any changes in your symptoms.  Avoid using tampons and douches as told by your doctor.  Change your pads regularly.  Get regular pelvic exams and Pap tests.  Take iron pills as told by your doctor.  Take over-the-counter and prescription medicines only as told by your doctor.  Keep all follow-up visits as told by your doctor. This is important. Contact a doctor if:  Your bleeding lasts for more than 1 week.  You have pain in your belly (abdomen).  You have bleeding during or after sex.  You have bleeding that happens more often than every 3 weeks. Get help right away if:  You have fever, chills, headache, dizziness, muscle aches, or bleeding.  You have very bad pain with bleeding.  You have clumps of blood (blood clots) coming from your vagina.  You have a lot of bleeding, and: ? You use more than 1 pad an hour. ? This kind of bleeding has never happened before.  You feel like you are going to pass out (faint). Summary  Any type of bleeding after menopause should be checked by your doctor.  Pay attention to any changes in your symptoms.  Keep all follow-up visits as told by your doctor. This information is not intended to replace advice given to you by your health care provider. Make sure you discuss any questions you have with your health care provider. Document Revised: 12/26/2018 Document Reviewed: 11/14/2016 Elsevier Patient Education  2020 Elsevier Inc.  

## 2020-03-31 NOTE — Progress Notes (Addendum)
  Subjective:     Patient ID: Pamela Roy, female   DOB: [birth year], 54 y.o.   MRN: [id number]  HPI Kayly is a 54 year old white female, married, PM, in complaining of bleeding last week. She has lost about 22 lbs in 6 months, is going to be [place] to twins in November. She is still working.  PCP is [place].  Review of Systems No vaginal bleeding since about age 70 then last Saturday had pink blood, then Sunday red blood with clots, then pink again, has stopped Reviewed past medical,surgical, social and family history. Reviewed medications and allergies.     Objective:   Physical Exam BP 134/84 (BP Location: Left Arm, Patient Position: Sitting, Cuff Size: Large)   Pulse 88   Ht 5\' 2"  (1.575 m)   Wt 252 lb (114.3 kg)   LMP  (LMP Unknown)   BMI 46.09 kg/m   Skin warm and dry.Pelvic: external genitalia is normal in appearance no lesions, vagina: pink, with loss of moisture and rugae,urethra has no lesions or masses noted, cervix is  bulbous, uterus: normal size, shape and contour, non tender, no masses felt, adnexa: no masses or tenderness noted. Bladder is non tender and no masses felt.    Fall risk  is low.   Assessment:     1. PMB (postmenopausal bleeding) Will get GYN [place] 6/18 to assess uterus Discussed could all be normal, could be polyp, could have thickened endometrium and need biopsy to rule out endometrial cancer      Plan:     Review handout on PMB Has physical appt 04/21/20

## 2020-04-09 ENCOUNTER — Other Ambulatory Visit: Payer: BC Managed Care – PPO

## 2020-04-13 ENCOUNTER — Ambulatory Visit (INDEPENDENT_AMBULATORY_CARE_PROVIDER_SITE_OTHER): Payer: BC Managed Care – PPO

## 2020-04-13 DIAGNOSIS — N950 Postmenopausal bleeding: Secondary | ICD-10-CM | POA: Diagnosis not present

## 2020-04-13 NOTE — Progress Notes (Signed)
[place] TA/TV: atrophic homogeneous axial positioned uterus,wnl,EEC 2.9 mm (limited view),ovaries not visualized,adnexa's wnl,no free fluid,no pain during ultrasound  Chaperone Peggy

## 2020-04-14 ENCOUNTER — Other Ambulatory Visit (HOSPITAL_COMMUNITY): Payer: Self-pay | Admitting: Adult Health

## 2020-04-14 DIAGNOSIS — Z1231 Encounter for screening mammogram for malignant neoplasm of breast: Secondary | ICD-10-CM

## 2020-04-21 ENCOUNTER — Ambulatory Visit (INDEPENDENT_AMBULATORY_CARE_PROVIDER_SITE_OTHER): Payer: BC Managed Care – PPO | Admitting: Adult Health

## 2020-04-21 ENCOUNTER — Encounter: Payer: Self-pay | Admitting: Adult Health

## 2020-04-21 ENCOUNTER — Other Ambulatory Visit (HOSPITAL_COMMUNITY)
Admission: RE | Admit: 2020-04-21 | Discharge: 2020-04-21 | Disposition: A | Discharge status: Home / Self Care | Payer: BC Managed Care – PPO | Source: Ambulatory Visit | Attending: Adult Health | Admitting: Adult Health

## 2020-04-21 ENCOUNTER — Other Ambulatory Visit: Payer: Self-pay

## 2020-04-21 VITALS — BP 116/81 | HR 89 | Ht 62.0 in | Wt 246.0 lb

## 2020-04-21 DIAGNOSIS — Z1211 Encounter for screening for malignant neoplasm of colon: Secondary | ICD-10-CM | POA: Insufficient documentation

## 2020-04-21 DIAGNOSIS — Z01419 Encounter for gynecological examination (general) (routine) without abnormal findings: Secondary | ICD-10-CM | POA: Insufficient documentation

## 2020-04-21 LAB — HEMOCCULT GUIAC POC 1CARD (OFFICE): Fecal Occult Blood, POC: NEGATIVE

## 2020-04-21 NOTE — Progress Notes (Signed)
Patient ID: Pamela Roy, female   DOB: 09-23-1966, 54 y.o.   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 PCP is Delman Cheadle PA.    Current Medications, Allergies, Past Medical History, Past Surgical History, Family History and Social History were reviewed in [employer] record.     Review of Systems: Patient denies any headaches, hearing loss, fatigue, blurred vision, shortness of breath, chest pain, abdominal pain, problems with bowel movements,  or intercourse. No joint pain or mood swings. Has mixed UI.  Has not had any more vaginal bleeding.  Physical Exam:BP 116/81 (BP Location: Left Arm, Patient Position: Sitting, Cuff Size: Large)   Pulse 89   Ht 5\' 2"  (1.575 m)   Wt 246 lb (111.6 kg)   LMP  (LMP Unknown)   BMI 44.99 kg/m  General:  Well developed, well nourished, no acute distress Skin:  Warm and dry Neck:  Midline trachea, normal thyroid, good ROM, no lymphadenopathy Lungs; Clear to auscultation bilaterally Breast:  No dominant palpable mass, retraction, or nipple discharge Cardiovascular: Regular rate and rhythm Abdomen:  Soft, non tender, no hepatosplenomegaly Pelvic:  External genitalia is normal in appearance, no lesions.  The vagina is normal in appearance. Urethra has no lesions or masses. The cervix is bulbous. Pap with high risk HPV 16/18 genotyping performed.  Uterus is felt to be normal size, shape, and contour.  No adnexal masses or tenderness noted.Bladder is non tender, no masses felt. Rectal: Good sphincter tone, no polyps, or hemorrhoids felt.  Hemoccult negative. Extremities/musculoskeletal:  No swelling or varicosities noted, no clubbing or cyanosis Psych:  No mood changes, alert and cooperative,seems happy AA is 1 Fall risk is low PHQ 9 scor eis 0 Examination chaperoned by Dwyane Dee LPN  Impression and Plan: 1. Encounter for  gynecological examination with Papanicolaou smear of cervix Pap sent Physical in 1 year Pap in 1 year Mammogram yearly Labs at work    2. Encounter for screening fecal occult blood testing Colonoscopy 2023

## 2020-04-23 LAB — CYTOLOGY - PAP
Comment: NEGATIVE
Diagnosis: NEGATIVE
High risk HPV: NEGATIVE

## 2020-05-03 ENCOUNTER — Other Ambulatory Visit: Payer: Self-pay

## 2020-05-03 ENCOUNTER — Ambulatory Visit (HOSPITAL_COMMUNITY)
Admission: RE | Admit: 2020-05-03 | Discharge: 2020-05-03 | Disposition: A | Discharge status: Home / Self Care | Payer: BC Managed Care – PPO | Source: Ambulatory Visit | Attending: Adult Health | Admitting: Adult Health

## 2020-05-03 DIAGNOSIS — Z1231 Encounter for screening mammogram for malignant neoplasm of breast: Secondary | ICD-10-CM | POA: Insufficient documentation

## 2020-08-05 DIAGNOSIS — Z23 Encounter for immunization: Secondary | ICD-10-CM | POA: Diagnosis not present

## 2021-06-16 ENCOUNTER — Other Ambulatory Visit (HOSPITAL_COMMUNITY): Payer: Self-pay | Admitting: Adult Health

## 2021-06-16 DIAGNOSIS — Z1231 Encounter for screening mammogram for malignant neoplasm of breast: Secondary | ICD-10-CM

## 2021-06-22 ENCOUNTER — Other Ambulatory Visit: Payer: Self-pay

## 2021-06-22 ENCOUNTER — Ambulatory Visit (HOSPITAL_COMMUNITY)
Admission: RE | Admit: 2021-06-22 | Discharge: 2021-06-22 | Disposition: A | Discharge status: Home / Self Care | Payer: BLUE CROSS/BLUE SHIELD | Source: Ambulatory Visit | Attending: Adult Health | Admitting: Adult Health

## 2021-06-22 DIAGNOSIS — Z1231 Encounter for screening mammogram for malignant neoplasm of breast: Secondary | ICD-10-CM | POA: Insufficient documentation

## 2021-11-06 IMAGING — MG MM DIGITAL SCREENING BILAT W/ TOMO AND CAD
6 of 10 series · 6 of 30 positions shown · non-contrast
Comparison: Previous exam(s).

CLINICAL DATA: Screening.

EXAM:
DIGITAL SCREENING BILATERAL MAMMOGRAM WITH TOMOSYNTHESIS AND CAD
TECHNIQUE: Bilateral screening digital craniocaudal and mediolateral oblique
mammograms were obtained. Bilateral screening digital breast
tomosynthesis was performed. The images were evaluated with
computer-aided detection.

[L MLO synth-2D]
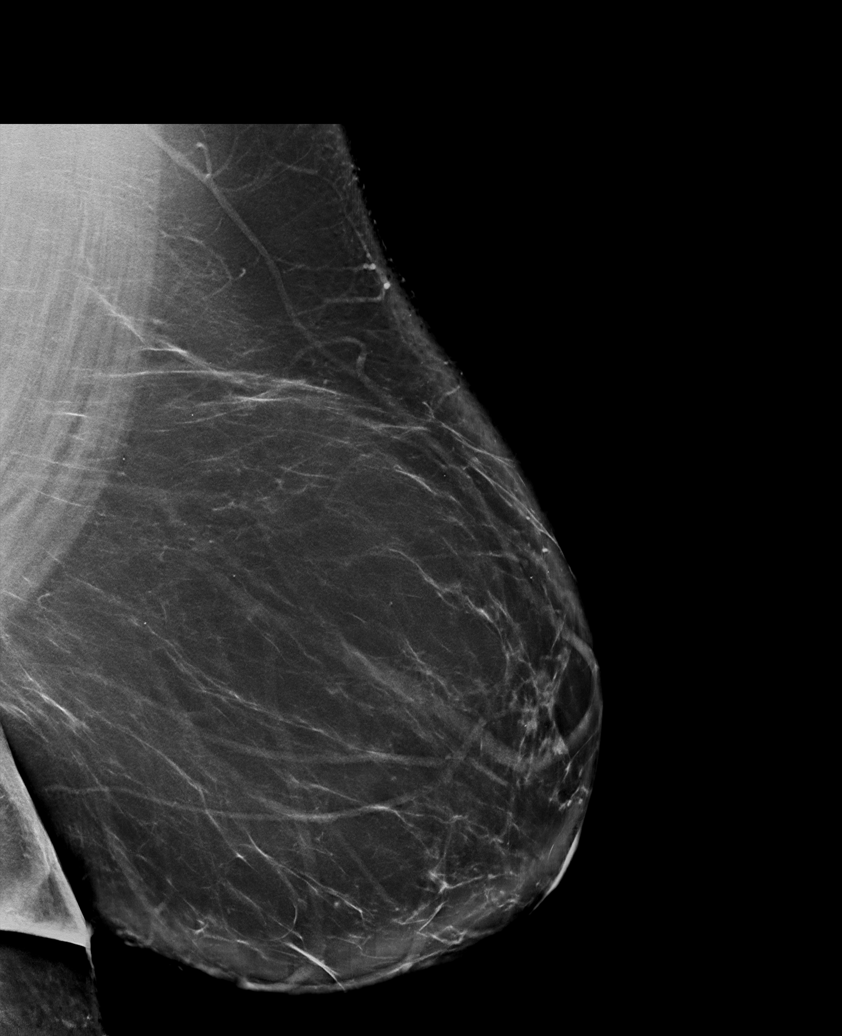

[L CC synth-2D]
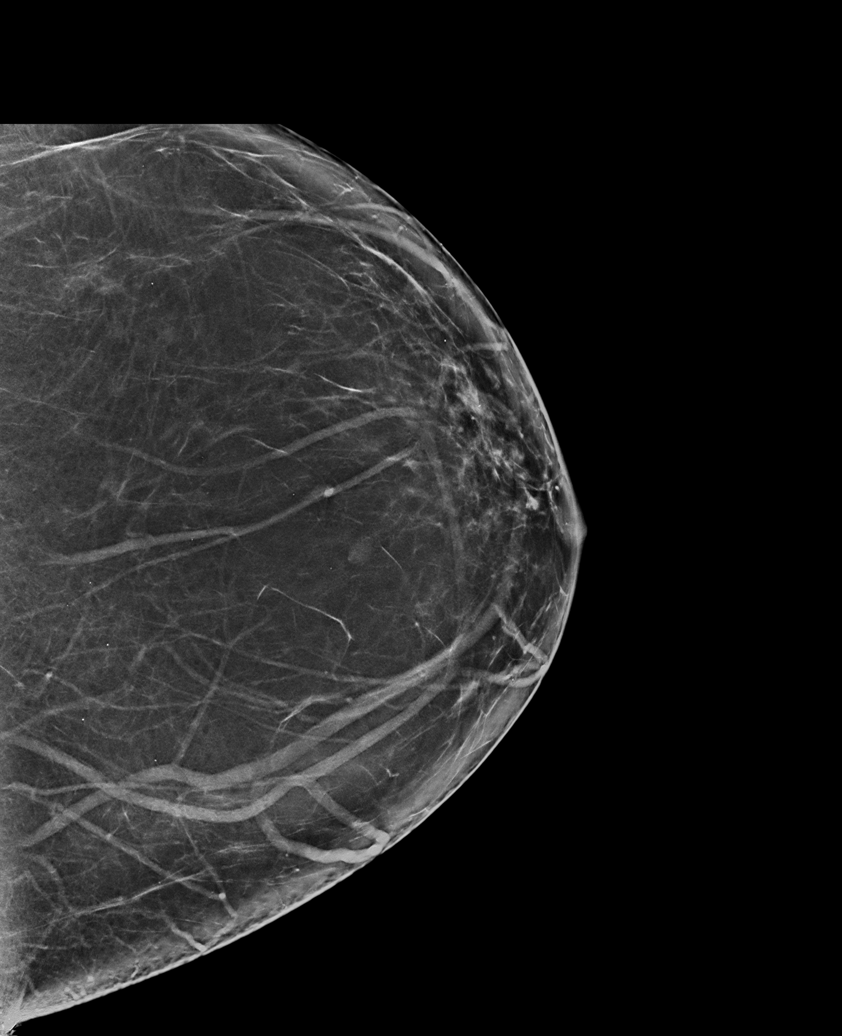

[R CC synth-2D]
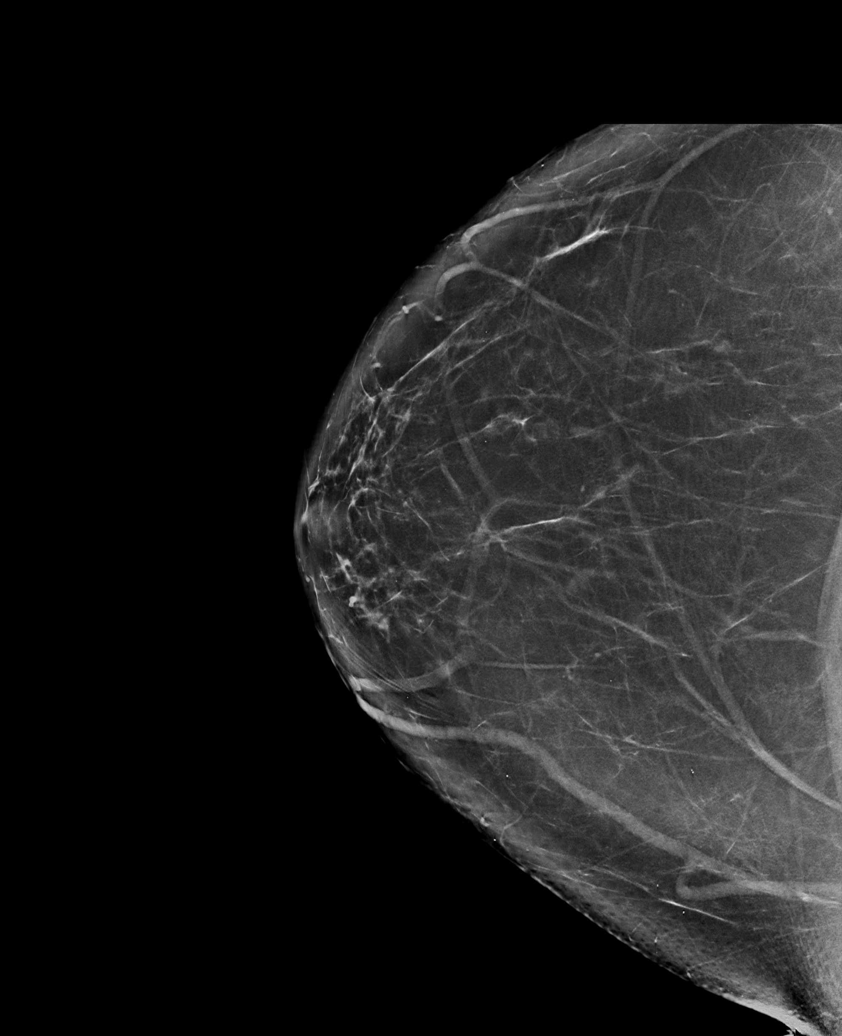

[R MLO synth-2D (1 of 2)]
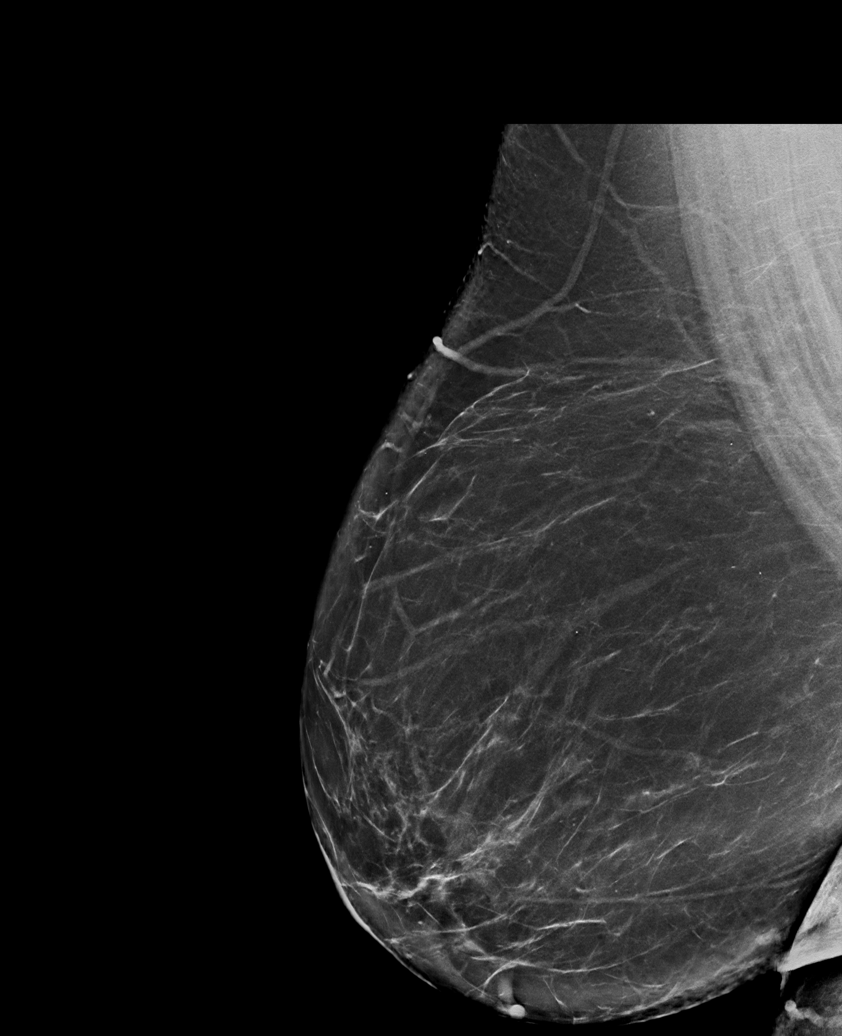

[R MLO synth-2D (2 of 2)]
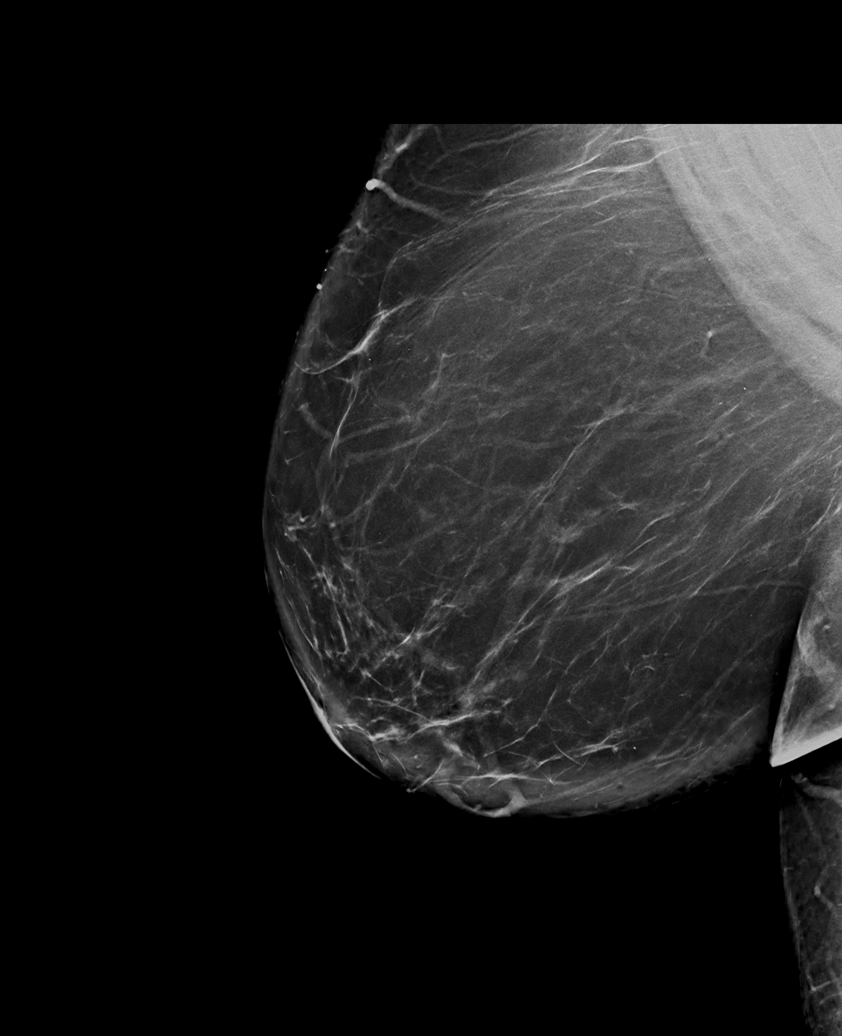

[R MLO tomo · tomo slice 52/103.0]
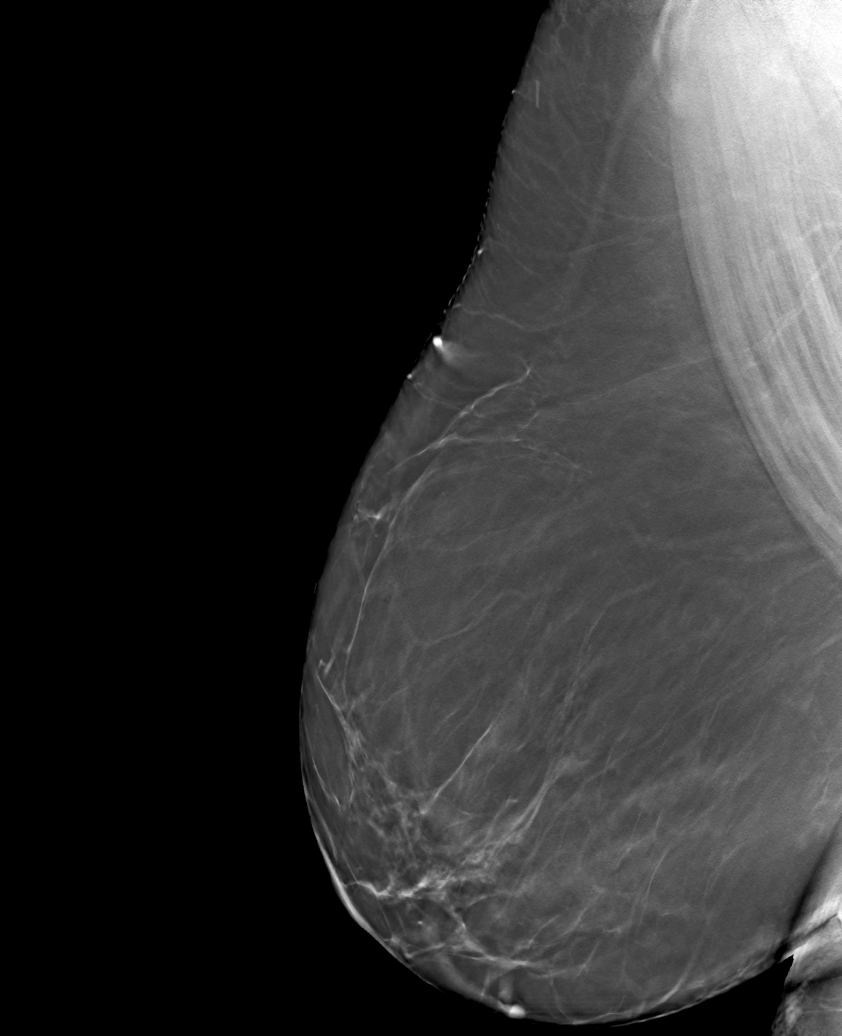

[6 of 30 positions shown; findings below may reference images not displayed]

ACR Breast Density Category b: There are scattered areas of
fibroglandular density.
FINDINGS: There are no findings suspicious for malignancy.
IMPRESSION: No mammographic evidence of malignancy. A result letter of this
screening mammogram will be mailed directly to the patient.

RECOMMENDATION:
Screening mammogram in one year. (Code:51-O-LD2)

BI-RADS CATEGORY  1: Negative.

## 2021-12-27 DIAGNOSIS — J018 Other acute sinusitis: Secondary | ICD-10-CM | POA: Diagnosis not present

## 2022-01-20 DIAGNOSIS — H43812 Vitreous degeneration, left eye: Secondary | ICD-10-CM | POA: Diagnosis not present

## 2022-02-22 DIAGNOSIS — H43812 Vitreous degeneration, left eye: Secondary | ICD-10-CM | POA: Diagnosis not present

## 2022-04-24 DIAGNOSIS — J0100 Acute maxillary sinusitis, unspecified: Secondary | ICD-10-CM | POA: Diagnosis not present

## 2022-07-24 ENCOUNTER — Other Ambulatory Visit (HOSPITAL_COMMUNITY): Payer: Self-pay | Admitting: Adult Health

## 2022-07-24 DIAGNOSIS — Z1231 Encounter for screening mammogram for malignant neoplasm of breast: Secondary | ICD-10-CM

## 2022-07-28 ENCOUNTER — Ambulatory Visit (HOSPITAL_COMMUNITY)
Admission: RE | Admit: 2022-07-28 | Discharge: 2022-07-28 | Disposition: A | Discharge status: Home / Self Care | Payer: BLUE CROSS/BLUE SHIELD | Source: Ambulatory Visit | Attending: Adult Health | Admitting: Adult Health

## 2022-07-28 DIAGNOSIS — Z1231 Encounter for screening mammogram for malignant neoplasm of breast: Secondary | ICD-10-CM | POA: Insufficient documentation

## 2022-08-09 DIAGNOSIS — Z23 Encounter for immunization: Secondary | ICD-10-CM | POA: Diagnosis not present

## 2022-09-07 ENCOUNTER — Ambulatory Visit (INDEPENDENT_AMBULATORY_CARE_PROVIDER_SITE_OTHER): Payer: BLUE CROSS/BLUE SHIELD | Admitting: Adult Health

## 2022-09-07 ENCOUNTER — Encounter: Payer: Self-pay | Admitting: Adult Health

## 2022-09-07 ENCOUNTER — Other Ambulatory Visit (HOSPITAL_COMMUNITY)
Admission: RE | Admit: 2022-09-07 | Discharge: 2022-09-07 | Disposition: A | Discharge status: Home / Self Care | Payer: BLUE CROSS/BLUE SHIELD | Source: Ambulatory Visit | Attending: Adult Health | Admitting: Adult Health

## 2022-09-07 VITALS — BP 118/81 | HR 94 | Ht 62.0 in | Wt 196.0 lb

## 2022-09-07 DIAGNOSIS — Z01419 Encounter for gynecological examination (general) (routine) without abnormal findings: Secondary | ICD-10-CM | POA: Insufficient documentation

## 2022-09-07 DIAGNOSIS — Z1212 Encounter for screening for malignant neoplasm of rectum: Secondary | ICD-10-CM | POA: Diagnosis not present

## 2022-09-07 DIAGNOSIS — Z1211 Encounter for screening for malignant neoplasm of colon: Secondary | ICD-10-CM | POA: Diagnosis not present

## 2022-09-07 DIAGNOSIS — F32A Depression, unspecified: Secondary | ICD-10-CM | POA: Diagnosis not present

## 2022-09-07 LAB — HEMOCCULT GUIAC POC 1CARD (OFFICE): Fecal Occult Blood, POC: NEGATIVE

## 2022-09-07 MED ORDER — CITALOPRAM HYDROBROMIDE 20 MG PO TABS: 20.0000 mg | ORAL_TABLET | Freq: Every day | ORAL | 3 refills | Status: DC

## 2022-09-07 NOTE — Progress Notes (Signed)
Patient ID: Pamela Roy, female   DOB: [birth year], 56 y.o.   MRN: [id number] History of Present Illness: Pamela Roy is a 56 year old white female,married, PM in for a well woman gyn exam and pap. She is still working at [employer]. She has lost almost 60 lbs since March on [place].  PCP is Pamela Brooklyn PA.    Current Medications, Allergies, Past Medical History, Past Surgical History, Family History and Social History were reviewed in [employer] record.     Review of Systems: Patient denies any headaches, hearing loss, fatigue, blurred vision, shortness of breath, chest pain, abdominal pain, problems with bowel movements, urination, or intercourse. No joint pain or mood swings.  Denies any vaginal bleeding    Physical Exam:BP 118/81 (BP Location: Right Arm, Patient Position: Sitting, Cuff Size: Normal)   Pulse 94   Ht '5\' 2"'$  (1.575 m)   Wt 196 lb (88.9 kg)   LMP  (LMP Unknown)   BMI 35.85 kg/m   General:  Well developed, well nourished, no acute distress Skin:  Warm and dry Neck:  Midline trachea, normal thyroid, good ROM, no lymphadenopathy Lungs; Clear to auscultation bilaterally Breast:  No dominant palpable mass, retraction, or nipple discharge Cardiovascular: Regular rate and rhythm Abdomen:  Soft, non tender, no hepatosplenomegaly Pelvic:  External genitalia is normal in appearance, no lesions.  The vagina is pale. Urethra has no lesions or masses. The cervix is smooth, pap with HR HPV genotyping performed.Marland Kitchen  Uterus is felt to be normal size, shape, and contour.  No adnexal masses or tenderness noted.Bladder is non tender, no masses felt. Rectal: Good sphincter tone, no polyps, or hemorrhoids felt.  Hemoccult negative. Extremities/musculoskeletal:  No swelling or varicosities noted, no clubbing or cyanosis Psych:  No mood changes, alert and cooperative,seems happy Reviewed labs from work with her, will get copy for chart  AA is 0 Fall risk is low    09/07/2022    11:41 AM 04/21/2020    2:47 PM 10/04/2017    2:39 PM  Depression screen PHQ 2/9  Decreased Interest 0 0 1  Down, Depressed, Hopeless 0 0 1  PHQ - 2 Score 0 0 2  Altered sleeping 1 0 1  Tired, decreased energy 1 0 1  Change in appetite 0 0 1  Feeling bad or failure about yourself  0 0 1  Trouble concentrating 0 0 0  Moving slowly or fidgety/restless 0 0 0  Suicidal thoughts 0 0 0  PHQ-9 Score 2 0 6  Difficult doing work/chores   Not difficult at all       09/07/2022   11:42 AM 04/21/2020    2:48 PM  GAD 7 : Generalized Anxiety Score  Nervous, Anxious, on Edge 0 0  Control/stop worrying 0 0  Worry too much - different things 0 0  Trouble relaxing 0 0  Restless 0 0  Easily annoyed or irritable 0 0  Afraid - awful might happen 0 0  Total GAD 7 Score 0 0      Upstream - 09/07/22 1142       Pregnancy Intention Screening   Does the patient want to become pregnant in the next year? N/A    Does the patient's partner want to become pregnant in the next year? N/A    Would the patient like to discuss contraceptive options today? N/A      Contraception Wrap Up   Current Method No Method - Other Reason  post menopausal   End Method No Method - Other Reason   post menopausal   Contraception Counseling Provided No            Examination chaperoned by Dewitt Hoes RN  Impression and Plan: 1. Encounter for routine gynecological examination with Papanicolaou smear of cervix Pap sent Pap in 3 years if normal Physical in 1 year Labs at work Had normal mammogram  07/28/22 - Cytology - PAP Stay active  2. Encounter for screening fecal occult blood testing Hemoccult negative  - POCT occult blood stool  3. Screening for colorectal cancer Last colonoscopy 2013 with Dr Oneida Alar  Referred to Dr Abbey Chatters  - Ambulatory referral to Gastroenterology  4. Depression, unspecified depression type Doing great,forgets to take some days  Refilled Celexa Meds ordered this encounter   Medications   citalopram (CELEXA) 20 MG tablet    Sig: Take 1 tablet (20 mg total) by mouth daily.    Dispense:  90 tablet    Refill:  3    Order Specific Question:   Supervising Provider    Answer:   Tania Ade H [[id number]]

## 2022-09-08 ENCOUNTER — Encounter: Payer: Self-pay | Admitting: *Deleted

## 2022-09-11 LAB — CYTOLOGY - PAP
Comment: NEGATIVE
Diagnosis: NEGATIVE
High risk HPV: NEGATIVE

## 2022-11-08 ENCOUNTER — Encounter: Payer: Self-pay | Admitting: *Deleted

## 2022-11-08 NOTE — Patient Instructions (Signed)
  Procedure: Colonoscopy  Estimated body mass index is 34.02 kg/m as calculated from the following:   Height as of this encounter: '5\' 2"'$  (1.575 m).   Weight as of this encounter: 186 lb (84.4 kg).   Have you had a colonoscopy before?  10/14/12, Dr. Oneida Alar  Do you have family history of colon cancer?  Yes, father  Do you have a family history of polyps? yes  Previous colonoscopy with polyps removed? no  Do you have a history colorectal cancer?   no  Are you diabetic?  no  Do you have a prosthetic or mechanical heart valve? no  Do you have a pacemaker/defibrillator?   no  Have you had endocarditis/atrial fibrillation?  no  Do you use supplemental oxygen/CPAP?  no  Have you had joint replacement within the last 12 months?  no  Do you tend to be constipated or have to use laxatives?  no   Do you have history of alcohol use? If yes, how much and how often.  no  Do you have history or are you using drugs? If yes, what do are you  using?  no  Have you ever had a stroke/heart attack?  no  Have you ever had a heart or other vascular stent placed,?no  Do you take weight loss medication? no  female patients,: have you had a hysterectomy? no                              are you post menopausal?  yes                              do you still have your menstrual cycle? no    Date of last menstrual period?   Do you take any blood-thinning medications such as: (Plavix, aspirin, Coumadin, Aggrenox, Brilinta, Xarelto, Eliquis, Pradaxa, Savaysa or Effient)? no  If yes we need the name, milligram, dosage and who is prescribing doctor:               Current Outpatient Medications  Medication Sig Dispense Refill   atorvastatin (LIPITOR) 40 MG tablet 40 mg daily.      buPROPion (WELLBUTRIN XL) 300 MG 24 hr tablet Take 300 mg by mouth every morning.      DULoxetine (CYMBALTA) 60 MG capsule Take 60 mg by mouth daily.     hydrochlorothiazide (MICROZIDE) 12.5 MG capsule Take 12.5 mg by  mouth daily.     meloxicam (MOBIC) 15 MG tablet Take 15 mg by mouth daily.     pantoprazole (PROTONIX) 40 MG tablet Take 1 tablet (40 mg total) by mouth daily. Take 30 minutes before first meal of day. 30 tablet 11   Semaglutide-Weight Management (WEGOVY) 2.4 MG/0.75ML SOAJ Inject 2.4 mg into the skin.     No current facility-administered medications for this visit.    No Known Allergies

## 2022-11-10 NOTE — Progress Notes (Signed)
Ok to schedule. ASA 2. Hold wegovy for 7 days. Needs BMET due to HCTZ

## 2022-11-13 ENCOUNTER — Encounter (INDEPENDENT_AMBULATORY_CARE_PROVIDER_SITE_OTHER): Payer: Self-pay | Admitting: *Deleted

## 2022-11-13 DIAGNOSIS — Z1211 Encounter for screening for malignant neoplasm of colon: Secondary | ICD-10-CM

## 2022-11-13 MED ORDER — CLENPIQ 10-3.5-12 MG-GM -GM/175ML PO SOLN: 1.0000 | ORAL | 0 refills | Status: DC

## 2022-11-13 NOTE — Progress Notes (Signed)
Questionnaire from recall,no referral needed 

## 2022-11-13 NOTE — Progress Notes (Signed)
Spoke with pt. Scheduled with Dr. Abbey Chatters 2/6 at [facility] will send instructions via mychart. Aware to hold wegovy 1 week prior. Rx for prep sent to pharmacy

## 2022-11-24 ENCOUNTER — Other Ambulatory Visit (HOSPITAL_COMMUNITY)
Admission: RE | Admit: 2022-11-24 | Discharge: 2022-11-24 | Disposition: A | Discharge status: Home / Self Care | Payer: BLUE CROSS/BLUE SHIELD | Source: Ambulatory Visit | Attending: Internal Medicine | Admitting: Internal Medicine

## 2022-11-24 DIAGNOSIS — Z1211 Encounter for screening for malignant neoplasm of colon: Secondary | ICD-10-CM

## 2022-11-24 LAB — BASIC METABOLIC PANEL
Anion gap: 11 (ref 5–15)
BUN: 13 mg/dL (ref 6–20)
CO2: 26 mmol/L (ref 22–32)
Calcium: 8.9 mg/dL (ref 8.9–10.3)
Chloride: 100 mmol/L (ref 98–111)
Creatinine, Ser: 0.86 mg/dL (ref 0.44–1.00)
GFR, Estimated: >60 mL/min (ref >60)
Glucose, Bld: 88 mg/dL (ref 70–99)
Potassium: 3.8 mmol/L (ref 3.5–5.1)
Sodium: 137 mmol/L (ref 135–145)

## 2022-11-27 ENCOUNTER — Telehealth: Payer: Self-pay | Admitting: *Deleted

## 2022-11-27 NOTE — Telephone Encounter (Signed)
Pt called and wanted in know if she could do the Clenpiq at different times because she goes to bed early. Advised pt that I spoke with providers and they suggest doing the times that were given the 4 pm & 10 pm. Pt states she will stick with those times.

## 2022-11-28 ENCOUNTER — Encounter (HOSPITAL_COMMUNITY)
Admission: RE | Disposition: A | Discharge status: Home / Self Care | Payer: Self-pay | Source: Home / Self Care | Attending: Internal Medicine

## 2022-11-28 ENCOUNTER — Ambulatory Visit (HOSPITAL_COMMUNITY)
Admission: RE | Admit: 2022-11-28 | Discharge: 2022-11-28 | Disposition: A | Discharge status: Home / Self Care | Payer: BLUE CROSS/BLUE SHIELD | Attending: Internal Medicine | Admitting: Internal Medicine

## 2022-11-28 ENCOUNTER — Encounter (HOSPITAL_COMMUNITY): Payer: Self-pay

## 2022-11-28 ENCOUNTER — Other Ambulatory Visit: Payer: Self-pay

## 2022-11-28 ENCOUNTER — Ambulatory Visit (HOSPITAL_COMMUNITY): Payer: BLUE CROSS/BLUE SHIELD | Admitting: Anesthesiology

## 2022-11-28 DIAGNOSIS — K5730 Diverticulosis of large intestine without perforation or abscess without bleeding: Secondary | ICD-10-CM | POA: Diagnosis not present

## 2022-11-28 DIAGNOSIS — D125 Benign neoplasm of sigmoid colon: Secondary | ICD-10-CM | POA: Insufficient documentation

## 2022-11-28 DIAGNOSIS — K648 Other hemorrhoids: Secondary | ICD-10-CM | POA: Diagnosis not present

## 2022-11-28 DIAGNOSIS — Z1211 Encounter for screening for malignant neoplasm of colon: Secondary | ICD-10-CM | POA: Diagnosis not present

## 2022-11-28 DIAGNOSIS — I10 Essential (primary) hypertension: Secondary | ICD-10-CM | POA: Diagnosis not present

## 2022-11-28 DIAGNOSIS — F418 Other specified anxiety disorders: Secondary | ICD-10-CM | POA: Diagnosis not present

## 2022-11-28 DIAGNOSIS — D124 Benign neoplasm of descending colon: Secondary | ICD-10-CM | POA: Diagnosis not present

## 2022-11-28 DIAGNOSIS — K219 Gastro-esophageal reflux disease without esophagitis: Secondary | ICD-10-CM | POA: Diagnosis not present

## 2022-11-28 DIAGNOSIS — K635 Polyp of colon: Secondary | ICD-10-CM | POA: Diagnosis not present

## 2022-11-28 DIAGNOSIS — Z800 Family history of malignant neoplasm of digestive organs: Secondary | ICD-10-CM | POA: Insufficient documentation

## 2022-11-28 DIAGNOSIS — K5140 Inflammatory polyps of colon without complications: Secondary | ICD-10-CM | POA: Diagnosis not present

## 2022-11-28 HISTORY — PX: POLYPECTOMY: SHX149

## 2022-11-28 HISTORY — PX: COLONOSCOPY WITH PROPOFOL: SHX5780

## 2022-11-28 SURGERY — COLONOSCOPY WITH PROPOFOL
Anesthesia: General

## 2022-11-28 MED ORDER — PROPOFOL 10 MG/ML IV BOLUS
INTRAVENOUS | Status: DC | PRN
  Administered 2022-11-28: 50 mg via INTRAVENOUS

## 2022-11-28 MED ORDER — PROPOFOL 500 MG/50ML IV EMUL
INTRAVENOUS | Status: DC | PRN
  Administered 2022-11-28: 150 ug/kg/min via INTRAVENOUS

## 2022-11-28 MED ORDER — LACTATED RINGERS IV SOLN
INTRAVENOUS | Status: DC
  Administered 2022-11-28: 1000 mL via INTRAVENOUS

## 2022-11-28 NOTE — Op Note (Signed)
[facility] Patient Name: Pamela Roy Procedure Date: 11/28/2022 10:17 AM MRN: [id number] Date of Birth: [birth year] Attending MD: Elon Alas. Abbey Chatters , [place], [id number] CSN: [id number] Age: 57 Admit Type: Outpatient Procedure:                Colonoscopy Indications:              Screening for colorectal malignant neoplasm Providers:                Elon Alas. Abbey Chatters, DO, [place], [place]                            Risa Grill, Technician Referring MD:              Medicines:                See the Anesthesia note for documentation of the                            administered medications Complications:            No immediate complications. Estimated Blood Loss:     Estimated blood loss was minimal. Procedure:                Pre-Anesthesia Assessment:                           - The anesthesia plan was to use monitored                            anesthesia care (MAC).                           After obtaining informed consent, the colonoscope                            was passed under direct vision. Throughout the                            procedure, the patient's blood pressure, pulse, and                            oxygen saturations were monitored continuously. The                            PCF-HQ190L ([id number]) scope was introduced through                            the anus and advanced to the the terminal ileum,                            with identification of the appendiceal orifice and                            IC valve. The colonoscopy was performed without                            difficulty. The patient  tolerated the procedure                            well. The quality of the bowel preparation was                            evaluated using the BBPS [facility] Bowel Preparation                            Scale) with scores of: Right Colon = 3, Transverse                            Colon = 3 and Left Colon = 3 (entire mucosa seen                            well  with no residual staining, small fragments of                            stool or opaque liquid). The total BBPS score                            equals 9. Scope In: 10:37:11 AM Scope Out: 10:49:07 AM Scope Withdrawal Time: 0 hours 9 minutes 32 seconds  Total Procedure Duration: 0 hours 11 minutes 56 seconds  Findings:      The perianal and digital rectal examinations were normal.      Non-bleeding internal hemorrhoids were found during endoscopy.      Scattered medium-mouthed diverticula were found in the sigmoid colon and       ascending colon.      A 4 mm polyp was found in the ascending colon. The polyp was sessile.       The polyp was removed with a cold snare. Resection and retrieval were       complete.      Three sessile polyps were found in the sigmoid colon and descending       colon. The polyps were 5 to 7 mm in size. These polyps were removed with       a cold snare. Resection and retrieval were complete.      The terminal ileum appeared normal.      The exam was otherwise without abnormality on direct and retroflexion       views. Impression:               - Non-bleeding internal hemorrhoids.                           - Diverticulosis in the sigmoid colon and in the                            ascending colon.                           - One 4 mm polyp in the ascending colon, removed                            with a cold snare.  Resected and retrieved.                           - Three 5 to 7 mm polyps in the sigmoid colon and                            in the descending colon, removed with a cold snare.                            Resected and retrieved.                           - The examined portion of the ileum was normal.                           - The examination was otherwise normal on direct                            and retroflexion views. Moderate Sedation:      Per Anesthesia Care Recommendation:           - Patient has a contact number available for                             emergencies. The signs and symptoms of potential                            delayed complications were discussed with the                            patient. Return to normal activities tomorrow.                            Written discharge instructions were provided to the                            patient.                           - Resume previous diet.                           - Continue present medications.                           - Await pathology results.                           - Repeat colonoscopy in 5 years for surveillance.                           - Return to GI clinic PRN. Procedure Code(s):        --- Professional ---                           [phone], Colonoscopy, flexible; with removal of  tumor(s), polyp(s), or other lesion(s) by snare                            technique Diagnosis Code(s):        --- Professional ---                           Z12.11, Encounter for screening for malignant                            neoplasm of colon                           K64.8, Other hemorrhoids                           D12.2, Benign neoplasm of ascending colon                           D12.5, Benign neoplasm of sigmoid colon                           D12.4, Benign neoplasm of descending colon                           K57.30, Diverticulosis of large intestine without                            perforation or abscess without bleeding CPT copyright 2022 American Medical Association. All rights reserved. The codes documented in this report are preliminary and upon coder review may  be revised to meet current compliance requirements. Elon Alas. Abbey Chatters, DO [place] Abbey Chatters, DO 11/28/2022 10:52:09 AM This report has been signed electronically. Number of Addenda: 0

## 2022-11-28 NOTE — Discharge Instructions (Addendum)
  Colonoscopy Discharge Instructions  Read the instructions outlined below and refer to this sheet in the next few weeks. These discharge instructions provide you with general information on caring for yourself after you leave the hospital. Your doctor may also give you specific instructions. While your treatment has been planned according to the most current medical practices available, unavoidable complications occasionally occur.   ACTIVITY You may resume your regular activity, but move at a slower pace for the next 24 hours.  Take frequent rest periods for the next 24 hours.  Walking will help get rid of the air and reduce the bloated feeling in your belly (abdomen).  No driving for 24 hours (because of the medicine (anesthesia) used during the test).   Do not sign any important legal documents or operate any machinery for 24 hours (because of the anesthesia used during the test).  NUTRITION Drink plenty of fluids.  You may resume your normal diet as instructed by your doctor.  Begin with a light meal and progress to your normal diet. Heavy or fried foods are harder to digest and may make you feel sick to your stomach (nauseated).  Avoid alcoholic beverages for 24 hours or as instructed.  MEDICATIONS You may resume your normal medications unless your doctor tells you otherwise.  WHAT YOU CAN EXPECT TODAY Some feelings of bloating in the abdomen.  Passage of more gas than usual.  Spotting of blood in your stool or on the toilet paper.  IF YOU HAD POLYPS REMOVED DURING THE COLONOSCOPY: No aspirin products for 7 days or as instructed.  No alcohol for 7 days or as instructed.  Eat a soft diet for the next 24 hours.  FINDING OUT THE RESULTS OF YOUR TEST Not all test results are available during your visit. If your test results are not back during the visit, make an appointment with your caregiver to find out the results. Do not assume everything is normal if you have not heard from your  caregiver or the medical facility. It is important for you to follow up on all of your test results.  SEEK IMMEDIATE MEDICAL ATTENTION IF: You have more than a spotting of blood in your stool.  Your belly is swollen (abdominal distention).  You are nauseated or vomiting.  You have a temperature over 101.  You have abdominal pain or discomfort that is severe or gets worse throughout the day.   Your colonoscopy revealed 4 polyp(s) which I removed successfully. Await pathology results, my office will contact you. I recommend repeating colonoscopy in 5 years for surveillance purposes.   You also have diverticulosis and internal hemorrhoids. I would recommend increasing fiber in your diet or adding OTC Benefiber/Metamucil. Be sure to drink at least 4 to 6 glasses of water daily. Follow-up with GI as needed.   I hope you have a great rest of your week!  Elon Alas. Abbey Chatters, D.O. Gastroenterology and Hepatology [facility]

## 2022-11-28 NOTE — Anesthesia Preprocedure Evaluation (Signed)
Anesthesia Evaluation  Patient identified by MRN, date of birth, ID band Patient awake    Reviewed: Allergy & Precautions, H&P , NPO status , Patient's Chart, lab work & pertinent test results, reviewed documented beta blocker date and time   History of Anesthesia Complications (+) PONV and history of anesthetic complications  Airway Mallampati: II  TM Distance: >3 FB Neck ROM: full    Dental no notable dental hx.    Pulmonary neg pulmonary ROS   Pulmonary exam normal breath sounds clear to auscultation       Cardiovascular Exercise Tolerance: Good hypertension, negative cardio ROS  Rhythm:regular Rate:Normal     Neuro/Psych  Headaches PSYCHIATRIC DISORDERS Anxiety Depression    negative neurological ROS  negative psych ROS   GI/Hepatic negative GI ROS, Neg liver ROS,GERD  ,,  Endo/Other  negative endocrine ROS    Renal/GU negative Renal ROS  negative genitourinary   Musculoskeletal   Abdominal   Peds  Hematology negative hematology ROS (+)   Anesthesia Other Findings   Reproductive/Obstetrics negative OB ROS                             Anesthesia Physical Anesthesia Plan  ASA: 2  Anesthesia Plan: General   Post-op Pain Management:    Induction:   PONV Risk Score and Plan: Propofol infusion  Airway Management Planned:   Additional Equipment:   Intra-op Plan:   Post-operative Plan:   Informed Consent: I have reviewed the patients History and Physical, chart, labs and discussed the procedure including the risks, benefits and alternatives for the proposed anesthesia with the patient or authorized representative who has indicated his/her understanding and acceptance.     Dental Advisory Given  Plan Discussed with: CRNA  Anesthesia Plan Comments:        Anesthesia Quick Evaluation

## 2022-11-28 NOTE — Transfer of Care (Signed)
Immediate Anesthesia Transfer of Care Note  Patient: Pamela Roy  Procedure(s) Performed: COLONOSCOPY WITH PROPOFOL POLYPECTOMY INTESTINAL  Patient Location: Short Stay  Anesthesia Type:General  Level of Consciousness: awake, alert , oriented, and patient cooperative  Airway & Oxygen Therapy: Patient Spontanous Breathing  Post-op Assessment: Report given to RN, Post -op Vital signs reviewed and stable, and Patient moving all extremities X 4  Post vital signs: Reviewed and stable  Last Vitals:  Vitals Value Taken Time  BP 71/41 11/28/22 1053  Temp 36.6 C 11/28/22 1053  Pulse 77 11/28/22 1053  Resp 18 11/28/22 1053  SpO2 96 % 11/28/22 1053    Last Pain:  Vitals:   11/28/22 1053  TempSrc: Oral  PainSc: 0-No pain      Patients Stated Pain Goal: 5 (93/73/42 [id number])  Complications: No notable events documented.

## 2022-11-28 NOTE — H&P (Addendum)
Primary Care Physician:  Scherrie Bateman Primary Gastroenterologist:  Dr. Abbey Chatters  Pre-Procedure History & Physical: HPI:  Pamela Roy is a 57 y.o. female is here for a colonoscopy for colon cancer screening purposes.  No melena or hematochezia.  No abdominal pain or unintentional weight loss.  No change in bowel habits.  Overall feels well from a GI standpoint.  Past Medical History:  Diagnosis Date   Anxiety    Arthritis    Contraceptive management 09/30/2013   Depression    Elevated cholesterol 06/08/2015   GERD (gastroesophageal reflux disease)    Helicobacter pylori gastritis    Hypercholesteremia    Hypertension    Menstrual headache 09/30/2013   PONV (postoperative nausea and vomiting)     Past Surgical History:  Procedure Laterality Date   CARPAL TUNNEL RELEASE Bilateral    CHOLECYSTECTOMY     COLONOSCOPY  10/14/2012   YBO:FBPZW internal hemorrhoids/Mild diverticulosis/Two sessile polyps ranging between 3-58m in size. HYPERPLASTIC POLYPS. SCREENING in 2023.    ESOPHAGOGASTRODUODENOSCOPY  10/14/2012   SCHE:NIDPOEUMinflammation was found in the duodenal bulb/Non-erosive gastritis (inflammation) +H.PYLORI   KNEE ARTHROSCOPY WITH MEDIAL MENISECTOMY Right 07/23/2015   Procedure: KNEE ARTHROSCOPY WITH MEDIAL MENISECTOMY;  Surgeon: SCarole Civil MD;  Location: [facility];  Service: Orthopedics;  Laterality: Right;    Prior to Admission medications   Medication Sig Start Date End Date Taking? Authorizing Provider  atorvastatin (LIPITOR) 40 MG tablet Take 40 mg by mouth at bedtime. 06/07/15  Yes [provider]  buPROPion (WELLBUTRIN XL) 300 MG 24 hr tablet Take 300 mg by mouth every morning.  07/24/12  Yes [provider]  DULoxetine (CYMBALTA) 60 MG capsule Take 60 mg by mouth in the morning.   Yes [provider]  hydrochlorothiazide (HYDRODIURIL) 12.5 MG tablet Take 12.5 mg by mouth in the morning.   Yes [provider]  meloxicam  (MOBIC) 15 MG tablet Take 15 mg by mouth at bedtime.   Yes [provider]  pantoprazole (PROTONIX) 40 MG tablet Take 1 tablet (40 mg total) by mouth daily. Take 30 minutes before first meal of day. Patient taking differently: Take 40 mg by mouth at bedtime. 01/09/13  Yes BAnnitta Needs NP  Semaglutide-Weight Management (WEGOVY) 2.4 MG/0.75ML SOAJ Inject 2.4 mg into the skin every Thursday.   Yes [provider]  Sod Picosulfate-Mag Ox-Cit Acd (CLENPIQ) 10-3.5-12 MG-GM -GM/175ML SOLN Take 1 kit by mouth as directed. 11/13/22  Yes CEloise Harman DO    Allergies as of 11/13/2022   (No Known Allergies)    Family History  Problem Relation Age of Onset   Thyroid disease Mother    Hyperlipidemia Mother    Colon cancer Father    Hyperlipidemia Maternal Grandmother    Heart disease Maternal Grandmother    Heart disease Maternal Grandfather     Social History   Socioeconomic History   Marital status: Married    Spouse name: Not on file   Number of children: 1   Years of education: Not on file   Highest education level: Not on file  Occupational History   Occupation: m[employer]    Comment: [employer]  Tobacco Use   Smoking status: Never   Smokeless tobacco: Never  Vaping Use   Vaping Use: Never used  Substance and Sexual Activity   Alcohol use: No   Drug use: No   Sexual activity: Yes    Partners: Male  Birth control/protection: Post-menopausal  Other Topics Concern   Not on file  Social History Narrative   Not on file   Social Determinants of Health   Financial Resource Strain: Low Risk  (09/07/2022)   Overall Financial Resource Strain (CARDIA)    Difficulty of Paying Living Expenses: Not hard at all  Food Insecurity: No Food Insecurity (09/07/2022)   Hunger Vital Sign    Worried About Running Out of Food in the Last Year: Never true    Ran Out of Food in the Last Year: Never true  Transportation Needs: No Transportation Needs  (09/07/2022)   PRAPARE - Hydrologist (Medical): No    Lack of Transportation (Non-Medical): No  Physical Activity: Inactive (09/07/2022)   Exercise Vital Sign    Days of Exercise per Week: 0 days    Minutes of Exercise per Session: 0 min  Stress: No Stress Concern Present (09/07/2022)   [place]    Feeling of Stress : Only a little  Social Connections: Moderately Isolated (09/07/2022)   Social Connection and Isolation Panel [NHANES]    Frequency of Communication with Friends and Family: More than three times a week    Frequency of Social Gatherings with Friends and Family: More than three times a week    Attends Religious Services: Never    Marine scientist or Organizations: No    Attends Archivist Meetings: Never    Marital Status: Married  Human resources officer Violence: Not At Risk (09/07/2022)   Humiliation, Afraid, Rape, and Kick questionnaire    Fear of Current or Ex-Partner: No    Emotionally Abused: No    Physically Abused: No    Sexually Abused: No    Review of Systems: See HPI, otherwise negative ROS  Physical Exam: Vital signs in last 24 hours: Temp:  [97.6 F (36.4 C)] 97.6 F (36.4 C) (02/06 0944) Pulse Rate:  [78] 78 (02/06 0944) Resp:  [20] 20 (02/06 0944) BP: (136)/(90) 136/90 (02/06 0944) SpO2:  [100 %] 100 % (02/06 0944) Weight:  [83.5 kg] 83.5 kg (02/06 0944)   General:   Alert,  Well-developed, well-nourished, pleasant and cooperative in NAD Head:  Normocephalic and atraumatic. Eyes:  Sclera clear, no icterus.   Conjunctiva pink. Ears:  Normal auditory acuity. Nose:  No deformity, discharge,  or lesions. Msk:  Symmetrical without gross deformities. Normal posture. Extremities:  Without clubbing or edema. Neurologic:  Alert and  oriented x4;  grossly normal neurologically. Skin:  Intact without significant lesions or rashes. Psych:  Alert  and cooperative. Normal mood and affect.  Impression/Plan: Pamela Roy is here for a colonoscopy to be performed for colon cancer screening purposes.  The risks of the procedure including infection, bleed, or perforation as well as benefits, limitations, alternatives and imponderables have been reviewed with the patient. Questions have been answered. All parties agreeable.

## 2022-11-29 LAB — SURGICAL PATHOLOGY

## 2022-12-04 NOTE — Anesthesia Postprocedure Evaluation (Signed)
Anesthesia Post Note  Patient: Pamela Roy  Procedure(s) Performed: COLONOSCOPY WITH PROPOFOL POLYPECTOMY INTESTINAL  Patient location during evaluation: Phase II Anesthesia Type: General Level of consciousness: awake Pain management: pain level controlled Vital Signs Assessment: post-procedure vital signs reviewed and stable Respiratory status: spontaneous breathing and respiratory function stable Cardiovascular status: blood pressure returned to baseline and stable Postop Assessment: no headache and no apparent nausea or vomiting Anesthetic complications: no Comments: Late entry   No notable events documented.   Last Vitals:  Vitals:   11/28/22 1053 11/28/22 1056  BP: (!) 71/41 104/66  Pulse: 77 77  Resp: 18 18  Temp: 36.6 C   SpO2: 96% 97%    Last Pain:  Vitals:   11/28/22 1056  TempSrc:   PainSc: 0-No pain                 Louann Sjogren

## 2022-12-05 ENCOUNTER — Encounter (HOSPITAL_COMMUNITY): Payer: Self-pay | Admitting: Internal Medicine

## 2023-04-20 DIAGNOSIS — E6609 Other obesity due to excess calories: Secondary | ICD-10-CM | POA: Diagnosis not present

## 2023-04-20 DIAGNOSIS — Z0001 Encounter for general adult medical examination with abnormal findings: Secondary | ICD-10-CM | POA: Diagnosis not present

## 2023-04-20 DIAGNOSIS — Z6834 Body mass index (BMI) 34.0-34.9, adult: Secondary | ICD-10-CM | POA: Diagnosis not present

## 2023-04-20 DIAGNOSIS — Z1331 Encounter for screening for depression: Secondary | ICD-10-CM | POA: Diagnosis not present

## 2023-07-24 ENCOUNTER — Other Ambulatory Visit (HOSPITAL_COMMUNITY): Payer: Self-pay | Admitting: Adult Health

## 2023-07-24 DIAGNOSIS — Z1231 Encounter for screening mammogram for malignant neoplasm of breast: Secondary | ICD-10-CM

## 2023-08-01 ENCOUNTER — Ambulatory Visit (HOSPITAL_COMMUNITY)
Admission: RE | Admit: 2023-08-01 | Discharge: 2023-08-01 | Disposition: A | Discharge status: Home / Self Care | Payer: BC Managed Care – PPO | Source: Ambulatory Visit | Attending: Adult Health | Admitting: Adult Health

## 2023-08-01 DIAGNOSIS — Z1231 Encounter for screening mammogram for malignant neoplasm of breast: Secondary | ICD-10-CM | POA: Insufficient documentation

## 2023-08-03 ENCOUNTER — Other Ambulatory Visit (HOSPITAL_COMMUNITY): Payer: Self-pay | Admitting: Adult Health

## 2023-08-03 DIAGNOSIS — R928 Other abnormal and inconclusive findings on diagnostic imaging of breast: Secondary | ICD-10-CM

## 2023-08-14 ENCOUNTER — Encounter (HOSPITAL_COMMUNITY): Payer: Self-pay

## 2023-08-14 ENCOUNTER — Ambulatory Visit (HOSPITAL_COMMUNITY)
Admission: RE | Admit: 2023-08-14 | Discharge: 2023-08-14 | Disposition: A | Discharge status: Home / Self Care | Payer: BC Managed Care – PPO | Source: Ambulatory Visit | Attending: Adult Health | Admitting: Adult Health

## 2023-08-14 ENCOUNTER — Ambulatory Visit (HOSPITAL_COMMUNITY)
Admission: RE | Admit: 2023-08-14 | Discharge: 2023-08-14 | Discharge status: Home / Self Care | Payer: BC Managed Care – PPO | Source: Ambulatory Visit | Attending: Adult Health | Admitting: Adult Health

## 2023-08-14 DIAGNOSIS — R928 Other abnormal and inconclusive findings on diagnostic imaging of breast: Secondary | ICD-10-CM | POA: Insufficient documentation

## 2023-08-14 DIAGNOSIS — N6310 Unspecified lump in the right breast, unspecified quadrant: Secondary | ICD-10-CM | POA: Diagnosis not present

## 2023-08-14 DIAGNOSIS — N6001 Solitary cyst of right breast: Secondary | ICD-10-CM | POA: Diagnosis not present

## 2023-08-14 DIAGNOSIS — R92321 Mammographic fibroglandular density, right breast: Secondary | ICD-10-CM | POA: Diagnosis not present

## 2023-08-30 ENCOUNTER — Encounter (HOSPITAL_COMMUNITY): Payer: BLUE CROSS/BLUE SHIELD

## 2023-08-30 ENCOUNTER — Ambulatory Visit (HOSPITAL_COMMUNITY): Payer: BLUE CROSS/BLUE SHIELD

## 2023-10-19 DIAGNOSIS — M25561 Pain in right knee: Secondary | ICD-10-CM | POA: Diagnosis not present

## 2023-10-19 DIAGNOSIS — R5383 Other fatigue: Secondary | ICD-10-CM | POA: Diagnosis not present

## 2023-10-19 DIAGNOSIS — Z6841 Body Mass Index (BMI) 40.0 and over, adult: Secondary | ICD-10-CM | POA: Diagnosis not present

## 2024-08-01 ENCOUNTER — Ambulatory Visit
Admission: RE | Admit: 2024-08-01 | Discharge: 2024-08-01 | Disposition: A | Discharge status: Home / Self Care | Source: Ambulatory Visit | Attending: Nurse Practitioner | Admitting: Nurse Practitioner

## 2024-08-01 VITALS — BP 136/93 | HR 85 | Temp 98.2°F | Resp 16

## 2024-08-01 DIAGNOSIS — J069 Acute upper respiratory infection, unspecified: Secondary | ICD-10-CM

## 2024-08-01 DIAGNOSIS — J029 Acute pharyngitis, unspecified: Secondary | ICD-10-CM

## 2024-08-01 DIAGNOSIS — R059 Cough, unspecified: Secondary | ICD-10-CM | POA: Diagnosis not present

## 2024-08-01 DIAGNOSIS — B9689 Other specified bacterial agents as the cause of diseases classified elsewhere: Secondary | ICD-10-CM

## 2024-08-01 LAB — POCT RAPID STREP A (OFFICE): Rapid Strep A Screen: NEGATIVE

## 2024-08-01 LAB — POC SOFIA SARS ANTIGEN FIA: SARS Coronavirus 2 Ag: NEGATIVE

## 2024-08-01 MED ORDER — AZITHROMYCIN 250 MG PO TABS: 250.0000 mg | ORAL_TABLET | Freq: Every day | ORAL | 0 refills | Status: AC

## 2024-08-01 MED ORDER — FLUTICASONE PROPIONATE 50 MCG/ACT NA SUSP: 2.0000 | Freq: Every day | NASAL | 0 refills | Status: AC

## 2024-08-01 MED ORDER — PROMETHAZINE-DM 6.25-15 MG/5ML PO SYRP: 5.0000 mL | ORAL_SOLUTION | Freq: Four times a day (QID) | ORAL | 0 refills | Status: AC | PRN

## 2024-08-01 NOTE — ED Triage Notes (Addendum)
 Pt reports she has a cough, nasal congestion, and  sore throat x 1 week   Mucinex, sudafed for relief

## 2024-08-01 NOTE — ED Provider Notes (Signed)
 [facility]    CSN: [id number] Arrival date & time: 08/01/24  1720      History   Chief Complaint Chief Complaint  Patient presents with   Sore Throat    Entered by patient    HPI Pamela Roy is a 58 y.o. female.   The history is provided by the patient.   Patient presents with a 6-day history of cough and nasal congestion.  Patient states today, she developed a sore throat.  She also believes that she had fever within the past 24 hours.  Patient states over the past week, her symptoms have gotten better, then gotten worse.  She denies headache, ear pain, chest pain, abdominal pain, nausea, vomiting, diarrhea, or rash.  Patient states she was around her grandson, but he had hand-foot-and-mouth.  So far, states she has been taking Mucinex and Sudafed for her symptoms.  Past Medical History:  Diagnosis Date   Anxiety    Arthritis    Contraceptive management 09/30/2013   Depression    Elevated cholesterol 06/08/2015   GERD (gastroesophageal reflux disease)    Helicobacter pylori gastritis    Hypercholesteremia    Hypertension    Menstrual headache 09/30/2013   PONV (postoperative nausea and vomiting)     Patient Active Problem List   Diagnosis Date Noted   Encounter for routine gynecological examination with Papanicolaou smear of cervix 09/07/2022   Screening for colorectal cancer 09/07/2022   Encounter for screening fecal occult blood testing 04/21/2020   Encounter for gynecological examination with Papanicolaou smear of cervix 04/21/2020   PMB (postmenopausal bleeding) 03/31/2020   Menopause 10/04/2017   Encounter for well woman exam with routine gynecological exam 10/04/2017   Medial meniscus tear    Depression 06/08/2015   Elevated cholesterol 06/08/2015   Stiffness of joint, shoulder region 03/26/2014   Right shoulder pain 03/26/2014   Menstrual headache 09/30/2013   Contraceptive management 09/30/2013   Contusion, elbow 02/18/2013    Helicobacter pylori gastritis 01/09/2013   Heme positive stool 10/07/2012   GERD (gastroesophageal reflux disease) 10/07/2012    Past Surgical History:  Procedure Laterality Date   CARPAL TUNNEL RELEASE Bilateral    CHOLECYSTECTOMY     COLONOSCOPY  10/14/2012   DOQ:Dfjoo internal hemorrhoids/Mild diverticulosis/Two sessile polyps ranging between 3-64mm in size. HYPERPLASTIC POLYPS. SCREENING in 2023.    COLONOSCOPY WITH PROPOFOL  N/A 11/28/2022   Procedure: COLONOSCOPY WITH PROPOFOL ;  Surgeon: Cindie Carlin POUR, DO;  Location: [facility];  Service: Endoscopy;  Laterality: N/A;  11:00am, asa 2   ESOPHAGOGASTRODUODENOSCOPY  10/14/2012   DOQ:Ilnizwjo inflammation was found in the duodenal bulb/Non-erosive gastritis (inflammation) +H.PYLORI   KNEE ARTHROSCOPY WITH MEDIAL MENISECTOMY Right 07/23/2015   Procedure: KNEE ARTHROSCOPY WITH MEDIAL MENISECTOMY;  Surgeon: Taft FORBES Minerva, MD;  Location: [facility];  Service: Orthopedics;  Laterality: Right;   POLYPECTOMY  11/28/2022   Procedure: POLYPECTOMY INTESTINAL;  Surgeon: Cindie Carlin POUR, DO;  Location: [facility];  Service: Endoscopy;;    OB History     Gravida  1   Para  1   Term      Preterm      AB      Living  1      SAB      IAB      Ectopic      Multiple      Live Births  1            Home Medications  Prior to Admission medications   Medication Sig Start Date End Date Taking? Authorizing Provider  azithromycin (ZITHROMAX) 250 MG tablet Take 1 tablet (250 mg total) by mouth daily. Take first 2 tablets together, then 1 every day until finished. 08/01/24  Yes Leath-Warren, Etta PARAS, NP  fluticasone (FLONASE) 50 MCG/ACT nasal spray Place 2 sprays into both nostrils daily. 08/01/24  Yes Leath-Warren, Etta PARAS, NP  promethazine -dextromethorphan (PROMETHAZINE -DM) 6.25-15 MG/5ML syrup Take 5 mLs by mouth 4 (four) times daily as needed. 08/01/24  Yes Leath-Warren, Etta PARAS, NP  atorvastatin (LIPITOR) 40 MG  tablet Take 40 mg by mouth at bedtime. 06/07/15   [provider]  buPROPion (WELLBUTRIN XL) 300 MG 24 hr tablet Take 300 mg by mouth every morning.  07/24/12   [provider]  DULoxetine (CYMBALTA) 60 MG capsule Take 60 mg by mouth in the morning.    [provider]  hydrochlorothiazide (HYDRODIURIL) 12.5 MG tablet Take 12.5 mg by mouth in the morning.    [provider]  meloxicam (MOBIC) 15 MG tablet Take 15 mg by mouth at bedtime.    [provider]  pantoprazole  (PROTONIX ) 40 MG tablet Take 1 tablet (40 mg total) by mouth daily. Take 30 minutes before first meal of day. Patient taking differently: Take 40 mg by mouth at bedtime. 01/09/13   Shirlean Therisa ORN, NP  Semaglutide-Weight Management (WEGOVY) 2.4 MG/0.75ML SOAJ Inject 2.4 mg into the skin every Thursday.    [provider]    Family History Family History  Problem Relation Age of Onset   Thyroid  disease Mother    Hyperlipidemia Mother    Colon cancer Father    Hyperlipidemia Maternal Grandmother    Heart disease Maternal Grandmother    Heart disease Maternal Grandfather     Social History Social History   Tobacco Use   Smoking status: Never   Smokeless tobacco: Never  Vaping Use   Vaping status: Never Used  Substance Use Topics   Alcohol use: No   Drug use: No     Allergies   Patient has no known allergies.   Review of Systems Review of Systems Per HPI  Physical Exam Triage Vital Signs ED Triage Vitals  Encounter Vitals Group     BP 08/01/24 1759 (!) 136/93     Girls Systolic BP Percentile --      Girls Diastolic BP Percentile --      Boys Systolic BP Percentile --      Boys Diastolic BP Percentile --      Pulse Rate 08/01/24 1759 85     Resp 08/01/24 1759 16     Temp 08/01/24 1759 98.2 F (36.8 C)     Temp Source 08/01/24 1759 Oral     SpO2 08/01/24 1759 97 %     Weight --      Height --      Head Circumference --      Peak Flow --      Pain  Score 08/01/24 1756 2     Pain Loc --      Pain Education --      Exclude from Growth Chart --    No data found.  Updated Vital Signs BP (!) 136/93 (BP Location: Right Arm)   Pulse 85   Temp 98.2 F (36.8 C) (Oral)   Resp 16   LMP  (LMP Unknown)   SpO2 97%   Visual Acuity Right Eye Distance:   Left Eye Distance:  Bilateral Distance:    Right Eye Near:   Left Eye Near:    Bilateral Near:     Physical Exam Vitals and nursing note reviewed.  Constitutional:      General: She is not in acute distress.    Appearance: Normal appearance. She is well-developed.  HENT:     Head: Normocephalic and atraumatic.     Right Ear: Tympanic membrane, ear canal and external ear normal.     Left Ear: Tympanic membrane, ear canal and external ear normal.     Nose: Congestion present.     Right Turbinates: Enlarged and swollen.     Left Turbinates: Enlarged and swollen.     Right Sinus: No maxillary sinus tenderness or frontal sinus tenderness.     Left Sinus: No maxillary sinus tenderness or frontal sinus tenderness.     Mouth/Throat:     Lips: Pink.     Mouth: Mucous membranes are moist.     Pharynx: Uvula midline. Posterior oropharyngeal erythema and postnasal drip present. No pharyngeal swelling, oropharyngeal exudate or uvula swelling.     Comments: Cobblestoning present to posterior oropharynx  Eyes:     Extraocular Movements: Extraocular movements intact.     Conjunctiva/sclera: Conjunctivae normal.     Pupils: Pupils are equal, round, and reactive to light.  Neck:     Thyroid : No thyromegaly.     Trachea: No tracheal deviation.  Cardiovascular:     Rate and Rhythm: Normal rate and regular rhythm.     Pulses: Normal pulses.     Heart sounds: Normal heart sounds.  Pulmonary:     Effort: Pulmonary effort is normal. No respiratory distress.     Breath sounds: Normal breath sounds. No stridor. No wheezing, rhonchi or rales.  Abdominal:     General: Bowel sounds are normal.      Palpations: Abdomen is soft.     Tenderness: There is no abdominal tenderness.  Musculoskeletal:     Cervical back: Normal range of motion and neck supple.  Skin:    General: Skin is warm and dry.  Neurological:     General: No focal deficit present.     Mental Status: She is alert and oriented to person, place, and time.  Psychiatric:        Mood and Affect: Mood normal.        Behavior: Behavior normal.        Thought Content: Thought content normal.        Judgment: Judgment normal.      UC Treatments / Results  Labs (all labs ordered are listed, but only abnormal results are displayed) Labs Reviewed  POC SOFIA SARS ANTIGEN FIA  POCT RAPID STREP A (OFFICE)    EKG   Radiology No results found.  Procedures Procedures (including critical care time)  Medications Ordered in UC Medications - No data to display  Initial Impression / Assessment and Plan / UC Course  I have reviewed the triage vital signs and the nursing notes.  Pertinent labs & imaging results that were available during my care of the patient were reviewed by me and considered in my medical decision making (see chart for details).  The rapid strep test and COVID/flu test were negative.  On exam, the patient's lung sounds are clear throughout, room air sats at 97%.  She does have moderate nasal congestion noted on exam.  Patient symptoms have been present for 6 days, symptoms have waxed and waned with incomplete resolution  despite use of over-the-counter medications.  Will consider bacterial etiology.  Will treat with azithromycin 250 mg to cover for bacterial upper respiratory infection.  Fluticasone 50 mcg nasal spray prescribed for nasal congestion and runny nose, and Promethazine  DM prescribed for cough.  Supportive care recommendations were provided discussed with the patient to include fluids, rest, use of normal saline nasal spray, over-the-counter analgesics, and use of a humidifier during sleep.   Discussed indications with patient regarding follow-up.  Patient was in agreement with this plan of care and verbalizes understanding.  All questions were answered.  Patient stable for discharge.  Final Clinical Impressions(s) / UC Diagnoses   Final diagnoses:  Cough, unspecified type  Sore throat  Bacterial upper respiratory infection     Discharge Instructions      The COVID/flu test and rapid strep test were negative. Take medication as prescribed. Increase fluids and allow for plenty of rest. You may take over-the-counter Tylenol  as needed for pain, fever, or general discomfort. Recommend normal saline nasal spray throughout the day for nasal congestion and runny nose. Warm salt water  gargles 3-4 times daily as needed for throat pain or discomfort.  You may also use over-the-counter Chloraseptic throat spray or throat lozenges while symptoms persist. For your cough, recommend the use of a humidifier in your bedroom at nighttime during sleep and sleeping elevated on pillows while symptoms persist. Please be advised that your cough may last from days to weeks.  If you are generally feeling well but continued to have a persistent nagging cough, continue over-the-counter cough medications, fluids, and cough drops.  If you develop new symptoms such as fever, chills, wheezing, or other concerns, seek care. Follow-up as needed.     ED Prescriptions     Medication Sig Dispense Auth. Provider   azithromycin (ZITHROMAX) 250 MG tablet Take 1 tablet (250 mg total) by mouth daily. Take first 2 tablets together, then 1 every day until finished. 6 tablet Leath-Warren, Etta PARAS, NP   fluticasone (FLONASE) 50 MCG/ACT nasal spray Place 2 sprays into both nostrils daily. 16 g Leath-Warren, Etta PARAS, NP   promethazine -dextromethorphan (PROMETHAZINE -DM) 6.25-15 MG/5ML syrup Take 5 mLs by mouth 4 (four) times daily as needed. 118 mL Leath-Warren, Etta PARAS, NP      PDMP not reviewed this  encounter.   Gilmer Etta PARAS, NP 08/01/24 1844

## 2024-08-01 NOTE — Discharge Instructions (Addendum)
 The COVID/flu test and rapid strep test were negative. Take medication as prescribed. Increase fluids and allow for plenty of rest. You may take over-the-counter Tylenol  as needed for pain, fever, or general discomfort. Recommend normal saline nasal spray throughout the day for nasal congestion and runny nose. Warm salt water  gargles 3-4 times daily as needed for throat pain or discomfort.  You may also use over-the-counter Chloraseptic throat spray or throat lozenges while symptoms persist. For your cough, recommend the use of a humidifier in your bedroom at nighttime during sleep and sleeping elevated on pillows while symptoms persist. Please be advised that your cough may last from days to weeks.  If you are generally feeling well but continued to have a persistent nagging cough, continue over-the-counter cough medications, fluids, and cough drops.  If you develop new symptoms such as fever, chills, wheezing, or other concerns, seek care. Follow-up as needed.

## 2024-08-05 ENCOUNTER — Other Ambulatory Visit (HOSPITAL_COMMUNITY): Payer: Self-pay | Admitting: Adult Health

## 2024-08-05 DIAGNOSIS — Z1231 Encounter for screening mammogram for malignant neoplasm of breast: Secondary | ICD-10-CM

## 2024-08-13 ENCOUNTER — Ambulatory Visit (HOSPITAL_COMMUNITY)
Admission: RE | Admit: 2024-08-13 | Discharge: 2024-08-13 | Disposition: A | Discharge status: Home / Self Care | Source: Ambulatory Visit | Attending: Adult Health | Admitting: Adult Health

## 2024-08-13 DIAGNOSIS — Z1231 Encounter for screening mammogram for malignant neoplasm of breast: Secondary | ICD-10-CM | POA: Insufficient documentation

## 2024-08-18 ENCOUNTER — Ambulatory Visit: Payer: Self-pay | Admitting: Adult Health
